# Patient Record
Sex: Female | Born: 1960 | Hispanic: Yes | Marital: Married | Smoking: Former smoker
Health system: Southern US, Community
[De-identification: ages and names within clinical notes are randomized; demographics above are authoritative.]

## PROBLEM LIST (undated history)

## (undated) DIAGNOSIS — I10 Essential (primary) hypertension: Secondary | ICD-10-CM

## (undated) DIAGNOSIS — M75100 Unspecified rotator cuff tear or rupture of unspecified shoulder, not specified as traumatic: Secondary | ICD-10-CM

## (undated) DIAGNOSIS — E785 Hyperlipidemia, unspecified: Secondary | ICD-10-CM

## (undated) DIAGNOSIS — H269 Unspecified cataract: Secondary | ICD-10-CM

## (undated) HISTORY — PX: BREAST EXCISIONAL BIOPSY: SUR124

## (undated) HISTORY — DX: Unspecified rotator cuff tear or rupture of unspecified shoulder, not specified as traumatic: M75.100

## (undated) HISTORY — DX: Hyperlipidemia, unspecified: E78.5

## (undated) HISTORY — PX: STOMACH SURGERY: SHX791

## (undated) HISTORY — DX: Unspecified cataract: H26.9

---

## 1993-11-28 HISTORY — PX: EYE SURGERY: SHX253

## 1996-11-28 HISTORY — PX: INNER EAR SURGERY: SHX679

## 1999-11-29 HISTORY — PX: BREAST EXCISIONAL BIOPSY: SUR124

## 2011-08-02 ENCOUNTER — Other Ambulatory Visit: Payer: Self-pay | Admitting: Sports Medicine

## 2011-08-02 DIAGNOSIS — M545 Low back pain, unspecified: Secondary | ICD-10-CM

## 2011-08-06 ENCOUNTER — Ambulatory Visit
Admission: RE | Admit: 2011-08-06 | Discharge: 2011-08-06 | Disposition: A | Discharge status: Home / Self Care | Payer: Federal, State, Local not specified - PPO | Source: Ambulatory Visit | Attending: Sports Medicine | Admitting: Sports Medicine

## 2011-08-06 DIAGNOSIS — M545 Low back pain, unspecified: Secondary | ICD-10-CM

## 2011-10-17 ENCOUNTER — Encounter: Payer: Self-pay | Admitting: Emergency Medicine

## 2011-10-17 ENCOUNTER — Emergency Department (HOSPITAL_COMMUNITY)
Admission: EM | Admit: 2011-10-17 | Discharge: 2011-10-17 | Disposition: A | Discharge status: Home / Self Care | Payer: Federal, State, Local not specified - PPO | Attending: Emergency Medicine | Admitting: Emergency Medicine

## 2011-10-17 DIAGNOSIS — R51 Headache: Secondary | ICD-10-CM | POA: Insufficient documentation

## 2011-10-17 DIAGNOSIS — B029 Zoster without complications: Secondary | ICD-10-CM | POA: Insufficient documentation

## 2011-10-17 DIAGNOSIS — I10 Essential (primary) hypertension: Secondary | ICD-10-CM | POA: Insufficient documentation

## 2011-10-17 DIAGNOSIS — R21 Rash and other nonspecific skin eruption: Secondary | ICD-10-CM | POA: Insufficient documentation

## 2011-10-17 DIAGNOSIS — X58XXXA Exposure to other specified factors, initial encounter: Secondary | ICD-10-CM | POA: Insufficient documentation

## 2011-10-17 DIAGNOSIS — E119 Type 2 diabetes mellitus without complications: Secondary | ICD-10-CM | POA: Insufficient documentation

## 2011-10-17 DIAGNOSIS — Z79899 Other long term (current) drug therapy: Secondary | ICD-10-CM | POA: Insufficient documentation

## 2011-10-17 DIAGNOSIS — Z7982 Long term (current) use of aspirin: Secondary | ICD-10-CM | POA: Insufficient documentation

## 2011-10-17 DIAGNOSIS — L089 Local infection of the skin and subcutaneous tissue, unspecified: Secondary | ICD-10-CM

## 2011-10-17 HISTORY — DX: Essential (primary) hypertension: I10

## 2011-10-17 MED ORDER — PROMETHAZINE HCL 25 MG PO TABS: 25.0000 mg | ORAL_TABLET | Freq: Four times a day (QID) | ORAL | Status: AC | PRN

## 2011-10-17 MED ORDER — ONDANSETRON 4 MG PO TBDP: 8.0000 mg | ORAL_TABLET | Freq: Once | ORAL | Status: DC

## 2011-10-17 MED ORDER — ACYCLOVIR 400 MG PO TABS: 800.0000 mg | ORAL_TABLET | Freq: Every day | ORAL | Status: AC

## 2011-10-17 MED ORDER — CEPHALEXIN 500 MG PO CAPS: 500.0000 mg | ORAL_CAPSULE | Freq: Four times a day (QID) | ORAL | Status: AC

## 2011-10-17 MED ORDER — ONDANSETRON 4 MG PO TBDP
ORAL_TABLET | ORAL | Status: AC
  Administered 2011-10-17: 8 mg via SUBLINGUAL
  Filled 2011-10-17: qty 2

## 2011-10-17 MED ORDER — OXYCODONE-ACETAMINOPHEN 5-325 MG PO TABS: 1.0000 | ORAL_TABLET | ORAL | Status: AC | PRN

## 2011-10-17 MED ORDER — FLUORESCEIN SODIUM 1 MG OP STRP
1.0000 | ORAL_STRIP | Freq: Once | OPHTHALMIC | Status: AC
  Administered 2011-10-17: 1 via OPHTHALMIC
  Filled 2011-10-17: qty 1

## 2011-10-17 MED ORDER — PROPARACAINE HCL 0.5 % OP SOLN
1.0000 [drp] | Freq: Once | OPHTHALMIC | Status: AC
  Administered 2011-10-17: 1 [drp] via OPHTHALMIC
  Filled 2011-10-17 (×2): qty 15

## 2011-10-17 MED ORDER — PREDNISONE 20 MG PO TABS: 20.0000 mg | ORAL_TABLET | Freq: Every day | ORAL | Status: AC

## 2011-10-17 MED ORDER — HYDROMORPHONE HCL PF 2 MG/ML IJ SOLN
2.0000 mg | Freq: Once | INTRAMUSCULAR | Status: AC
  Administered 2011-10-17: 2 mg via INTRAMUSCULAR
  Filled 2011-10-17: qty 1

## 2011-10-17 NOTE — ED Notes (Signed)
Patient signed discharge instructions but feels nauseated.  Nedra Hai, RN notified.

## 2011-10-17 NOTE — ED Notes (Signed)
Rash to right forehead and scalp, started as one spot above eye that disappeared and then a few more popped up and then continued to spread, painful and itchy.  No problems with eye itself.  Saw dr Thursday and was placed on prednisone with no relief.

## 2011-10-17 NOTE — ED Notes (Signed)
Pt states pain medicine helped pain but has made her nauseated, given zofran as ordered for same.

## 2011-10-17 NOTE — ED Provider Notes (Signed)
Medical screening examination/treatment/procedure(s) were performed by non-physician practitioner and as supervising physician I was immediately available for consultation/collaboration.  [name]. Rubin Payor, MD 10/17/11 [phone]

## 2011-10-17 NOTE — ED Provider Notes (Signed)
History     CSN: [id number] Arrival date & time: 10/17/2011  8:26 AM   First MD Initiated Contact with Patient 10/17/11 [phone]      Chief Complaint  Patient presents with  . Rash   HPI Patient seen and evaluated at [phone]. Reports that she developed a rash on her right forehead one week prior to her arrival to the emergency room. Patient states that the lesions have spread. States that the pain radiates into her head. States that it feels as though she is being "electricuted" on her scalp. Patient denies any visual deficits or eye pain. Denies any respiratory distress. Patient states that she was started on Prednisone by her primary care physician Dr. Barry Dienes. Patient denies any other complaints.   Past Medical History  Diagnosis Date  . Diabetes mellitus   . Hypertension     Past Surgical History  Procedure Date  . Eye surgery     No family history on file.  History  Substance Use Topics  . Smoking status: Never Smoker   . Smokeless tobacco: Not on file  . Alcohol Use: Yes     socially    OB History    Grav Para Term Preterm Abortions TAB SAB Ect Mult Living                  Review of Systems  Constitutional: Negative for fever, chills, diaphoresis and appetite change.  HENT: Negative for ear pain, sore throat, trouble swallowing, neck pain, neck stiffness and voice change.   Eyes: Negative for photophobia, pain, discharge, redness, itching and visual disturbance.  Respiratory: Negative for cough, chest tightness and shortness of breath.   Cardiovascular: Negative for chest pain.  Gastrointestinal: Negative for nausea, vomiting and abdominal pain.  Genitourinary: Negative for flank pain.  Musculoskeletal: Negative for back pain.  Skin: Positive for rash. Negative for color change and wound.  Neurological: Negative for weakness and numbness.  All other systems reviewed and are negative.    Allergies  Review of patient's allergies indicates no known  allergies.  Home Medications   Current Outpatient Rx  Name Route Sig Dispense Refill  . ASPIRIN EC 81 MG PO TBEC Oral Take 81 mg by mouth daily.      [facility] Ophthalmic Apply 1-2 drops to eye daily as needed. In right eye. For itchy eyes.     Marland Kitchen FEXOFENADINE HCL 180 MG PO TABS Oral Take 180 mg by mouth daily.      Marland Kitchen GLIMEPIRIDE 1 MG PO TABS Oral Take 0.5 mg by mouth 2 (two) times daily.      . IBUPROFEN 200 MG PO TABS Oral Take 400 mg by mouth every 6 (six) hours as needed. For pain.     Marland Kitchen LOSARTAN POTASSIUM-HCTZ 100-25 MG PO TABS Oral Take 1 tablet by mouth daily.      Marland Kitchen METFORMIN HCL 1000 MG PO TABS Oral Take 1,000 mg by mouth 2 (two) times daily with a meal.      . METOPROLOL SUCCINATE 50 MG PO TB24 Oral Take 50 mg by mouth daily.      . MOMETASONE FUROATE 50 MCG/ACT NA SUSP Nasal Place 2 sprays into the nose daily.      Marland Kitchen PREDNISONE 10 MG PO TABS Oral Take 10-60 mg by mouth daily. Tapered dose.started taking them on Thursday 11/15. Took 6 on 11/15, 5 on the 2nd day on 11/16, 4 on 11/17, 3 on 11/18, take 2 on 11/19 and 1  tab on 11/20.     Marland Kitchen SIMVASTATIN 40 MG PO TABS Oral Take 40 mg by mouth at bedtime.        BP 141/88  Pulse 78  Temp(Src) 97.8 F (36.6 C) (Oral)  Resp 16  SpO2 98%  Physical Exam  Nursing note and vitals reviewed. Constitutional: She is oriented to person, place, and time. She appears well-developed and well-nourished. No distress.  HENT:  Head: Normocephalic and atraumatic.    Eyes: Conjunctivae and EOM are normal. Pupils are equal, round, and reactive to light. Right eye exhibits no discharge. Left eye exhibits no discharge. No scleral icterus.         No dendritic lesions noted on examination with fluorescein and propocaine. No abnormalities.   Neck: Normal range of motion. Neck supple. No tracheal deviation present.  Cardiovascular: Normal rate, regular rhythm and normal heart sounds.  Exam reveals no gallop and no friction rub.   No murmur  heard. Pulmonary/Chest: Effort normal and breath sounds normal. No stridor. No respiratory distress. She has no wheezes. She has no rales.  Abdominal: Soft. Bowel sounds are normal. She exhibits no distension. There is no tenderness.  Musculoskeletal: Normal range of motion.  Lymphadenopathy:    She has no cervical adenopathy.  Neurological: She is alert and oriented to person, place, and time. No cranial nerve deficit. Coordination normal.  Skin: Skin is warm and dry. Rash noted. She is not diaphoretic.  Psychiatric: She has a normal mood and affect. Her behavior is normal. Judgment and thought content normal.    ED Course  Procedures (including critical care time)  Patient seen and evaluated.  VSS reviewed. . Nursing notes reviewed. Discussed with and seen with Dr. Rubin Payor, attending physician. Initial testing ordered. Will monitor the patient closely. They agree with the treatment plan and diagnosis.  Patient has a superimposed infection, will treat with keflex. Recommendation per dr. Rubin Payor.   MDM  Rash        Demetrius Charity, PA 10/17/11 1023

## 2011-11-29 HISTORY — PX: COLONOSCOPY: SHX174

## 2011-12-26 ENCOUNTER — Ambulatory Visit: Payer: Federal, State, Local not specified - PPO | Admitting: Family Medicine

## 2012-01-27 ENCOUNTER — Ambulatory Visit (INDEPENDENT_AMBULATORY_CARE_PROVIDER_SITE_OTHER): Payer: Federal, State, Local not specified - PPO | Admitting: Surgery

## 2012-02-14 ENCOUNTER — Ambulatory Visit: Payer: Federal, State, Local not specified - PPO | Admitting: Family Medicine

## 2012-02-15 ENCOUNTER — Other Ambulatory Visit (INDEPENDENT_AMBULATORY_CARE_PROVIDER_SITE_OTHER): Payer: Self-pay | Admitting: Surgery

## 2012-02-15 ENCOUNTER — Encounter (INDEPENDENT_AMBULATORY_CARE_PROVIDER_SITE_OTHER): Payer: Self-pay | Admitting: Surgery

## 2012-02-15 ENCOUNTER — Ambulatory Visit (INDEPENDENT_AMBULATORY_CARE_PROVIDER_SITE_OTHER): Payer: Federal, State, Local not specified - PPO | Admitting: Surgery

## 2012-02-15 HISTORY — DX: Morbid (severe) obesity due to excess calories: E66.01

## 2012-02-15 NOTE — Progress Notes (Signed)
Re:   Megan Gomez DOB:   Apr 17, 1961 MRN:   [id number]  ASSESSMENT AND PLAN: 1.  Morbid obesity, weight - 219, BMI -38.8.  Per the 1991 NIH Consensus Statement, the patient is a candidate for bariatric surgery.  The patient attended our information session and reviewed the different types of bariatric surgery.    The patient is interested in the laparoscopic adjustable gastric band.  I discussed with the patient the indications and risks of lap band surgery.  The potential risks of surgery include, but are not limited to, bleeding, infection, DVT and PE, slippage and erosion of the band, open surgery, and death.  The patient understands the importance of compliance and long term follow-up with our group after surgery.  She was given literature regarding lap band surgery and encouraged to visit their web site, [email or web address], and register.  From here we'll obtain labs, x-rays, nutrition consult, and psych consult.  2.  Hypertension.  Since 2008. 3.  Diabetes mellitus.  Since 2001.  Sees Dr. Ester Rink. 4.  Back disease.  History of 3 ruptured disks.  Chief Complaint  Patient presents with  . Other    new bariatric- lap band   REFERRING PHYSICIAN:  Dr. Ester Rink.  HISTORY OF PRESENT ILLNESS: Megan Gomez is a 51 y.o. (DOB: January 21, 1961)  white female whose primary care physician is Nelwyn Salisbury, MD, MD (She was seeing Dr. Jillyn Hidden, but is becoming a new patient of Dr. Claris Che.) and comes to me today for bariatric surgery.  She has a sister who had a RYGB in [place].  She has done well.  The patient has been doing information session that I spoke at. She has actually been twice to the information sessions.  She does not know anyone who has a LAP-BAND, but is interested in lap band surgery. At the last information session, he got to feel the lap band port.  I talked about attending the support group.  She has tried Weight Watchers, Atkins diet, starvation diets, and some injections while  she lived [place], [place]. She had the most success with Atkins diet, but was concerned about the long-term effects of being on the diet.  She has no history of stomach disease.  No history of liver disease.  No history of gall bladder disease.  No history of pancreas disease.  No history of colon disease.   Past Medical History  Diagnosis Date  . Diabetes mellitus   . Hypertension   . Hyperlipidemia       Past Surgical History  Procedure Date  . Eye surgery   . Cesarean section 2001, 2005  . Breast biopsy 2004    left  . Inner ear surgery 1998     Current Outpatient Prescriptions  Medication Sig Dispense Refill  . aspirin EC 81 MG tablet Take 81 mg by mouth daily.        Marland Kitchen glimepiride (AMARYL) 1 MG tablet Take 0.5 mg by mouth 2 (two) times daily.        Marland Kitchen loratadine (CLARITIN) 10 MG tablet Take 10 mg by mouth daily.      Marland Kitchen losartan-hydrochlorothiazide (HYZAAR) 100-25 MG per tablet Take 1 tablet by mouth daily.        . metFORMIN (GLUCOPHAGE) 1000 MG tablet Take 1,000 mg by mouth 2 (two) times daily with a meal.        . metoprolol (TOPROL-XL) 50 MG 24 hr tablet Take 50 mg by mouth daily.        Marland Kitchen  simvastatin (ZOCOR) 40 MG tablet Take 40 mg by mouth at bedtime.          Allergies  Allergen Reactions  . Demerol Nausea And Vomiting  . Stadol (Butorphanol Tartrate) Nausea And Vomiting    REVIEW OF SYSTEMS: Skin:  No history of rash.  No history of abnormal moles. Infection:  No history of hepatitis or HIV.  No history of MRSA. Neurologic:  No history of stroke.  No history of seizure.  No history of headaches. Cardiac:  Hypertension x 5 years. No history of heart disease.  No history of prior cardiac catheterization.  No history of seeing a cardiologist. Pulmonary:  Does not smoke cigarettes.  No asthma or bronchitis.  No OSA/CPAP.  Endocrine:  Diabetes x 10 years.  Sees Dr. Ester Rink. No thyroid disease. Gastrointestinal:  See HPI. Urologic:  No history of kidney  stones.  No history of bladder infections. GYN:  Irregular periods.  She has two children, 8 and 12, both by C section. Musculoskeletal:  No history of joint or back disease. Hematologic:  Legs cramp.  She has 3 disks ruptured in her back. She dislocated her hip in 2011. Psycho-social:  The patient is oriented.   The patient has no obvious psychologic or social impairment to understanding our conversation and plan.  SOCIAL and FAMILY HISTORY: Married.  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 2 children: 8 and 12 years.  PHYSICAL EXAM: BP 162/98  Pulse 88  Temp(Src) 98.2 F (36.8 C) (Temporal)  Resp 12  Ht 5\' 3"  (1.6 m)  Wt 219 lb 3.2 oz (99.428 kg)  BMI 38.83 kg/m2  General: WN obese WF who is alert and generally healthy appearing.  HEENT: Normal. Pupils equal. Good dentition. Neck: Supple. No mass.  No thyroid mass.  Carotid pulse okay with no bruit. Lymph Nodes:  No supraclavicular or cervical nodes. Lungs: Clear to auscultation and symmetric breath sounds.  Heart:  RRR. No murmur or rub. Abdomen: Soft. No mass. No tenderness. No hernia. Normal bowel sounds.  C section scar. Rectal: Guaiac neg stool. Extremities:  Good strength and ROM  in upper and lower extremities. Neurologic:  Grossly intact to motor and sensory function. Psychiatric: Has normal mood and affect. Behavior is normal.   DATA REVIEWED: Chart information.  Ovidio Kin, MD,  [facility] [address] [place]  [address]   [place], [place]    [address] Phone:  [phone] FAX:  [phone]

## 2012-03-13 ENCOUNTER — Ambulatory Visit: Payer: Federal, State, Local not specified - PPO | Admitting: Family Medicine

## 2012-03-14 ENCOUNTER — Ambulatory Visit (HOSPITAL_COMMUNITY): Payer: Federal, State, Local not specified - PPO

## 2012-03-28 ENCOUNTER — Encounter: Payer: Self-pay | Admitting: *Deleted

## 2012-03-28 ENCOUNTER — Encounter: Payer: Federal, State, Local not specified - PPO | Attending: Surgery | Admitting: *Deleted

## 2012-03-28 DIAGNOSIS — Z01818 Encounter for other preprocedural examination: Secondary | ICD-10-CM | POA: Insufficient documentation

## 2012-03-28 DIAGNOSIS — Z713 Dietary counseling and surveillance: Secondary | ICD-10-CM | POA: Insufficient documentation

## 2012-03-28 NOTE — Patient Instructions (Signed)
   Follow Pre-Op Nutrition Goals to prepare for Lap Band Surgery.   Call the [facility] at [phone] once you have been given your surgery date to enrolled in the Pre-Op Nutrition Class. You will need to attend this nutrition class 3-4 weeks prior to your surgery.  

## 2012-03-28 NOTE — Progress Notes (Signed)
  Pre-Op Assessment Visit:  Pre-Operative LAGB Surgery  Medical Nutrition Therapy:  Appt start time: 1500 end time:  1600.  Patient was seen on 03/28/2012 for Pre-Operative LAGB Nutrition Assessment. Assessment and letter of approval faxed to [facility] Bariatric Surgery Program coordinator on 03/29/12.  Approval letter and Candace Gallus results sent to [facility] center and will be available in the chart under the media tab.  TANITA  BODY COMP RESULTS  03/28/12    %Fat 45.5%    FM (lbs) 101.0    FFM (lbs) 121.0    TBW (lbs) 88.5   Handouts given during visit include:  Pre-Op Goals   Bariatric Protein Shakes  B.E.L.T. Program Flyer  Patient to call for Pre-Op and Post-Op Nutrition Education at the [facility] when surgery is scheduled.

## 2012-04-04 ENCOUNTER — Ambulatory Visit (HOSPITAL_COMMUNITY)
Admission: RE | Admit: 2012-04-04 | Discharge: 2012-04-04 | Disposition: A | Discharge status: Home Health | Payer: Federal, State, Local not specified - PPO | Source: Ambulatory Visit | Attending: Surgery | Admitting: Surgery

## 2012-04-04 ENCOUNTER — Encounter (HOSPITAL_COMMUNITY)
Admission: RE | Disposition: A | Discharge status: Home Health | Payer: Self-pay | Source: Ambulatory Visit | Attending: Surgery

## 2012-04-04 DIAGNOSIS — E669 Obesity, unspecified: Secondary | ICD-10-CM | POA: Insufficient documentation

## 2012-04-04 DIAGNOSIS — Z01818 Encounter for other preprocedural examination: Secondary | ICD-10-CM | POA: Insufficient documentation

## 2012-04-04 HISTORY — PX: BREATH TEK H PYLORI: SHX5422

## 2012-04-04 SURGERY — BREATH TEST, FOR HELICOBACTER PYLORI
Anesthesia: Choice

## 2012-04-05 ENCOUNTER — Encounter (HOSPITAL_COMMUNITY): Payer: Self-pay

## 2012-04-05 ENCOUNTER — Encounter (HOSPITAL_COMMUNITY): Payer: Self-pay | Admitting: Surgery

## 2012-04-13 ENCOUNTER — Ambulatory Visit (HOSPITAL_COMMUNITY)
Admission: RE | Admit: 2012-04-13 | Discharge: 2012-04-13 | Disposition: A | Discharge status: Home / Self Care | Payer: Federal, State, Local not specified - PPO | Source: Ambulatory Visit | Attending: Surgery | Admitting: Surgery

## 2012-04-13 ENCOUNTER — Encounter (HOSPITAL_COMMUNITY): Payer: Self-pay

## 2012-04-13 ENCOUNTER — Other Ambulatory Visit: Payer: Self-pay

## 2012-04-13 DIAGNOSIS — I10 Essential (primary) hypertension: Secondary | ICD-10-CM | POA: Insufficient documentation

## 2012-04-13 DIAGNOSIS — R12 Heartburn: Secondary | ICD-10-CM | POA: Insufficient documentation

## 2012-04-13 DIAGNOSIS — Z6838 Body mass index (BMI) 38.0-38.9, adult: Secondary | ICD-10-CM | POA: Insufficient documentation

## 2012-04-13 DIAGNOSIS — K7689 Other specified diseases of liver: Secondary | ICD-10-CM | POA: Insufficient documentation

## 2012-04-13 DIAGNOSIS — K219 Gastro-esophageal reflux disease without esophagitis: Secondary | ICD-10-CM | POA: Insufficient documentation

## 2012-04-13 DIAGNOSIS — E119 Type 2 diabetes mellitus without complications: Secondary | ICD-10-CM | POA: Insufficient documentation

## 2012-04-13 DIAGNOSIS — R160 Hepatomegaly, not elsewhere classified: Secondary | ICD-10-CM | POA: Insufficient documentation

## 2012-04-13 DIAGNOSIS — N200 Calculus of kidney: Secondary | ICD-10-CM | POA: Insufficient documentation

## 2012-04-13 DIAGNOSIS — E785 Hyperlipidemia, unspecified: Secondary | ICD-10-CM | POA: Insufficient documentation

## 2012-04-20 ENCOUNTER — Other Ambulatory Visit (INDEPENDENT_AMBULATORY_CARE_PROVIDER_SITE_OTHER): Payer: Self-pay | Admitting: Surgery

## 2012-04-20 ENCOUNTER — Telehealth (INDEPENDENT_AMBULATORY_CARE_PROVIDER_SITE_OTHER): Payer: Self-pay | Admitting: General Surgery

## 2012-04-20 NOTE — Telephone Encounter (Signed)
Kesha from [facility] called to state that the patient advised she had the A1C performed last month by her doctor at [facility] and her insurance will not pay for the one ordered by Dr. Ezzard Standing. I advised the other tests ordered (CBC, TSH, CMET) need to proceed as planned and for the patient to please contact her doctor who originally ordered the A1C to fax those results to Dr. Allene Pyo attention. And that if those results (based on timing) were not sufficient, then he or his nurse will advise.

## 2012-04-21 LAB — CBC WITH DIFFERENTIAL/PLATELET
Hemoglobin: 13 g/dL (ref 12.0–15.0)
Lymphocytes Relative: 23 % (ref 12–46)
Lymphs Abs: 1.8 10*3/uL (ref 0.7–4.0)
MCH: 27.8 pg (ref 26.0–34.0)
Monocytes Relative: 5 % (ref 3–12)
Neutrophils Relative %: 70 % (ref 43–77)
Platelets: 336 10*3/uL (ref 150–400)
RBC: 4.68 MIL/uL (ref 3.87–5.11)
WBC: 8 10*3/uL (ref 4.0–10.5)

## 2012-04-21 LAB — COMPREHENSIVE METABOLIC PANEL
ALT: 26 U/L (ref 0–35)
Albumin: 4.2 g/dL (ref 3.5–5.2)
CO2: 28 mEq/L (ref 19–32)
Calcium: 9.2 mg/dL (ref 8.4–10.5)
Chloride: 102 mEq/L (ref 96–112)
Glucose, Bld: 147 mg/dL — ABNORMAL HIGH (ref 70–99)
Potassium: 4.1 mEq/L (ref 3.5–5.3)
Sodium: 138 mEq/L (ref 135–145)
Total Bilirubin: 0.3 mg/dL (ref 0.3–1.2)
Total Protein: 7 g/dL (ref 6.0–8.3)

## 2012-04-21 LAB — TSH: TSH: 1.575 u[IU]/mL (ref 0.350–4.500)

## 2012-04-24 ENCOUNTER — Telehealth (INDEPENDENT_AMBULATORY_CARE_PROVIDER_SITE_OTHER): Payer: Self-pay

## 2012-04-24 NOTE — Telephone Encounter (Signed)
I left a voicemail requesting a copy of the latest Hgb A1c result faxed to [place]

## 2012-06-12 ENCOUNTER — Encounter (INDEPENDENT_AMBULATORY_CARE_PROVIDER_SITE_OTHER): Payer: Self-pay

## 2012-07-13 ENCOUNTER — Other Ambulatory Visit (INDEPENDENT_AMBULATORY_CARE_PROVIDER_SITE_OTHER): Payer: Self-pay | Admitting: Surgery

## 2012-08-02 ENCOUNTER — Encounter: Payer: Federal, State, Local not specified - PPO | Attending: Surgery | Admitting: *Deleted

## 2012-08-02 DIAGNOSIS — Z01818 Encounter for other preprocedural examination: Secondary | ICD-10-CM | POA: Insufficient documentation

## 2012-08-02 DIAGNOSIS — Z713 Dietary counseling and surveillance: Secondary | ICD-10-CM | POA: Insufficient documentation

## 2012-08-02 NOTE — Progress Notes (Addendum)
Bariatric Class:  Appt start time: 1530 end time:  1630.  Pre-Operative Nutrition Class  Patient was seen on 08/02/12 for Pre-Operative Bariatric Surgery Education at [facility].   Surgery date: 08/28/12 Surgery type: LAGB Start weight at [facility]: 222 lbs  Weight today: 220.2 lbs  Samples given per MNT protocol: Bariatric Advantage Multivitamin  Lot # [id number]  Exp: 12/13   Opurity Calcium Citrate  Lot # [id number]  Exp: 11/14   Opurity Band-Optimized Multivitamin  Lot # [id number]  Exp: 02/14   Celebrate Vitamins Multivitamin (2)  Lot # [id number]; [id number]  Exp: 09/14; 01/15   Celebrate Vitamins Multivitamin-Complete  Lot # [id number]  Exp: 06/14   Celebrate Vitamins Iron + C (18mg )  Lot # [id number]  Exp: 03/15   Celebrate Vitamins Sublingual B12  Lot # [id number]  Exp: 05/15   Corliss Marcus Protein Powder  Lot # [id number]  Exp: 09/14  The following the learning objective met by the patient during this course:   Identifies Pre-Op Dietary Goals and will begin 2 weeks pre-operatively  Identifies appropriate sources of fluids and proteins   States protein recommendations and appropriate sources pre and post-operatively  Identifies Post-Operative Dietary Goals and will follow for 2 weeks post-operatively  Identifies appropriate multivitamin and calcium sources  Describes the need for physical activity post-operatively and will follow MD recommendations  States when to call healthcare provider regarding medication questions or post-operative complications  Handouts given during class include:  Pre-Op Bariatric Surgery Diet Handout  Protein Shake Handout  Post-Op Bariatric Surgery Nutrition Handout  BELT Program Information Flyer  Support Group Information Flyer  Follow-Up Plan: Patient will follow-up at [facility] 2 weeks post operatively for diet advancement per MD.

## 2012-08-03 NOTE — Patient Instructions (Signed)
Follow:   Pre-Op Diet per MD 2 weeks prior to surgery  Phase 2- Liquids (clear/full) 2 weeks after surgery  Vitamin/Mineral/Calcium guidelines for purchasing bariatric supplements  Exercise guidelines pre and post-op per MD  Follow-up at [facility] in 2 weeks post-op for diet advancement. Contact Zephaniah Lubrano as needed with questions/concerns. 

## 2012-08-04 ENCOUNTER — Encounter: Payer: Self-pay | Admitting: *Deleted

## 2012-08-16 ENCOUNTER — Encounter (HOSPITAL_COMMUNITY): Payer: Self-pay | Admitting: Pharmacy Technician

## 2012-08-17 ENCOUNTER — Encounter (INDEPENDENT_AMBULATORY_CARE_PROVIDER_SITE_OTHER): Payer: Self-pay | Admitting: Surgery

## 2012-08-17 ENCOUNTER — Ambulatory Visit (INDEPENDENT_AMBULATORY_CARE_PROVIDER_SITE_OTHER): Payer: Federal, State, Local not specified - PPO | Admitting: Surgery

## 2012-08-17 NOTE — Progress Notes (Signed)
Re:   Megan Gomez DOB:   02-22-1961 MRN:   [id number]  ASSESSMENT AND PLAN: 1.  Morbid obesity, weight - 219, BMI -38.8.  Per the 1991 NIH Consensus Statement, the patient is a candidate for bariatric surgery.  The patient attended our information session and reviewed the different types of bariatric surgery.    The patient is interested in the laparoscopic adjustable gastric band.  I discussed with the patient the indications and risks of lap band surgery.  The potential risks of surgery include, but are not limited to, bleeding, infection, DVT and PE, slippage and erosion of the band, open surgery, and death.  The patient understands the importance of compliance and long term follow-up with our group after surgery.  She has completed her pre op eval and is scheduled for surgery on 08/28/2012.  She has started her pre op diet and has no significant questions.  She wants to be done as an outpatient and we will decide the day of surgery, but she should be able to go home.  2.  Hypertension.  Since 2008. 3.  Diabetes mellitus.  Oral hypoglycemics only.  Since 2001.  Sees Dr. Ester Rink.  He has outlined a post op plan for her with her diabetes. 4.  Back disease.  History of 3 ruptured disks. 5.  Left kidney stone by [place]. 6.  Hepatic steatosis by [place]. 7.  Tertiary esophageal spasms and GERD - essentially asymptomatic on UGI.  No chief complaint on file.  REFERRING PHYSICIAN:  Dr. Ester Rink.  HISTORY OF PRESENT ILLNESS: Megan Gomez is a 51 y.o. (DOB: 27-Oct-1961)  white female whose primary care physician is FULP, CAMMIE, MD (She was seeing Dr. Jillyn Hidden, but is becoming a new patient of Dr. Claris Che.) and comes to me today for pre op visit for a lap band scheduled 08/28/2012.  She has a sister who had a RYGB in [place].  She has done well.  I went over with her the surgery.  We talked about diabetes and the lap band.  I said that we do encourage patients with severe insulin dependent diabetes  towards the RYGB, but with weight loss, the lap band does help diabetes.  I reviewed with her the model of the lap band and the post op course.  UGI - 02/15/2012 - Marked reflux with tertiary contractions, no HH. [place] - 02/15/2012 - Hepatic steatosis. Psychiatry - 06/09/2012 - Dr. Cyndia Skeeters She brought paper work with her - Bone density - okay, she is set up for a colonoscopy by Dr. Loreta Ave in November 2013, 08/09/2012 - negative mammogram, 9/ 13/2013 - normal pap smear.  She has no history of stomach disease.  No history of liver disease.  No history of gall bladder disease.  No history of pancreas disease.  No history of colon disease.   Past Medical History  Diagnosis Date  . Diabetes mellitus   . Hypertension   . Hyperlipidemia     Current Outpatient Prescriptions  Medication Sig Dispense Refill  . aspirin EC 81 MG tablet Take 81 mg by mouth daily.        . B Complex-C (B-COMPLEX WITH VITAMIN C) tablet Take 1 tablet by mouth daily.      . Biotin (RA BIOTIN) 1000 MCG tablet Take 2,000 mcg by mouth daily.      . Calcium Carbonate-Vitamin D (CALCIUM 600 + D PO) Take 2 tablets by mouth daily.      Marland Kitchen glimepiride (AMARYL) 1 MG  tablet Take 0.5 mg by mouth 2 (two) times daily.        Marland Kitchen losartan-hydrochlorothiazide (HYZAAR) 100-25 MG per tablet Take 1 tablet by mouth daily before breakfast.       . metFORMIN (GLUCOPHAGE) 1000 MG tablet Take 1,000 mg by mouth 2 (two) times daily with a meal.        . metoprolol tartrate (LOPRESSOR) 25 MG tablet Take 25 mg by mouth 2 (two) times daily.      . Multiple Vitamin (MULTIVITAMIN) capsule Take 1 capsule by mouth daily.      . simvastatin (ZOCOR) 40 MG tablet Take 40 mg by mouth at bedtime.        Marland Kitchen DISCONTD: fexofenadine (ALLEGRA) 180 MG tablet Take 180 mg by mouth as needed.       Marland Kitchen DISCONTD: mometasone (NASONEX) 50 MCG/ACT nasal spray Place 2 sprays into the nose daily.          Allergies  Allergen Reactions  . Cephalexin Swelling  . Demerol Nausea And  Vomiting  . Stadol (Butorphanol Tartrate) Nausea And Vomiting    REVIEW OF SYSTEMS:  Cardiac:  Hypertension x 5 years. No history of heart disease.  No history of prior cardiac catheterization.    Endocrine:  Diabetes x 10 years.  Sees Dr. Ester Rink. No thyroid disease. Gastrointestinal:  See HPI. GYN:  Irregular periods.  She has two children, 8 and 12, both by C section.  Hematologic:  Legs cramp.  She has 3 disks ruptured in her back. She dislocated her hip in 2011. Psycho-social:  The patient is oriented.   The patient has no obvious psychologic or social impairment to understanding our conversation and plan.  She saw Dr. Cyndia Skeeters for psychiatry.  SOCIAL and FAMILY HISTORY: Married.  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 2 children: 8 and 12 years.  PHYSICAL EXAM: There were no vitals taken for this visit.  General: WN obese WF who is alert and generally healthy appearing.  HEENT: Normal. Pupils equal. Good dentition. Neck: Supple. No mass.  No thyroid mass.   Lymph Nodes:  No supraclavicular or cervical nodes. Lungs: Clear to auscultation and symmetric breath sounds. Heart:  RRR. No murmur or rub.  Abdomen: Soft. No mass. No tenderness. No hernia. Normal bowel sounds.  C section scar.  She is more apple than pear. Extremities:  Good strength and ROM  in upper and lower extremities. Neurologic:  Grossly intact to motor and sensory function. Psychiatric: Has normal mood and affect. Behavior is normal.   DATA REVIEWED: Reports in the HPI> TSH - 1.575 - 04/20/2012.  Ovidio Kin, MD,  [facility] [address] [place]  [address]   [place], [place]    [address] Phone:  [phone] FAX:  [phone]

## 2012-08-22 ENCOUNTER — Ambulatory Visit (HOSPITAL_COMMUNITY)
Admission: RE | Admit: 2012-08-22 | Discharge: 2012-08-22 | Disposition: A | Discharge status: Home / Self Care | Payer: Federal, State, Local not specified - PPO | Source: Ambulatory Visit | Attending: Surgery | Admitting: Surgery

## 2012-08-22 ENCOUNTER — Encounter (HOSPITAL_COMMUNITY)
Admission: RE | Admit: 2012-08-22 | Discharge: 2012-08-22 | Disposition: A | Discharge status: Home / Self Care | Payer: Federal, State, Local not specified - PPO | Source: Ambulatory Visit | Attending: Surgery | Admitting: Surgery

## 2012-08-22 ENCOUNTER — Encounter (HOSPITAL_COMMUNITY): Payer: Self-pay

## 2012-08-22 DIAGNOSIS — Z01812 Encounter for preprocedural laboratory examination: Secondary | ICD-10-CM | POA: Insufficient documentation

## 2012-08-22 DIAGNOSIS — Z01818 Encounter for other preprocedural examination: Secondary | ICD-10-CM | POA: Insufficient documentation

## 2012-08-22 LAB — HCG, SERUM, QUALITATIVE: Preg, Serum: NEGATIVE

## 2012-08-22 LAB — CBC
HCT: 37.8 % (ref 36.0–46.0)
Hemoglobin: 12.7 g/dL (ref 12.0–15.0)
MCHC: 33.6 g/dL (ref 30.0–36.0)
MCV: 82.7 fL (ref 78.0–100.0)

## 2012-08-22 LAB — BASIC METABOLIC PANEL
BUN: 14 mg/dL (ref 6–23)
Chloride: 101 mEq/L (ref 96–112)
Creatinine, Ser: 0.52 mg/dL (ref 0.50–1.10)
GFR calc non Af Amer: >90 mL/min (ref >90)
Glucose, Bld: 92 mg/dL (ref 70–99)
Potassium: 3.8 mEq/L (ref 3.5–5.1)

## 2012-08-22 NOTE — Progress Notes (Signed)
04-13-2012 ekg epic

## 2012-08-22 NOTE — Patient Instructions (Addendum)
20 Megan Gomez  08/22/2012   Your procedure is scheduled on:  08-28-2012  Report to [facility] at 0515  AM.  Call this number if you have problems the morning of surgery: [phone]   Remember:if you are discharged   Do not eat food or drink liquids:After Midnight.  .  Take these medicines the morning of surgery with A SIP OF WATER: metoprolol tartrate  Do not wear jewelry or make up.  Do not wear lotions, powders, or perfumes. You may wear deodorant.    Do not bring valuables to the hospital.  Contacts, dentures or bridgework may not be worn into surgery.  Leave suitcase in the car. After surgery it may be brought to your room.  For patients admitted to the hospital, checkout time is 11:00 AM the day of discharge                             Patients discharged the day of surgery will not be allowed to drive home. If going home same day of surgery, you must have someone stay with you the first 24 hours at home and arrange for some one to drive you home from hospital.    Special Instructions: See [facility] Preparing for Surgery instruction sheet. Women do not shave legs or underarms for 12 hours before showers. Men may shave face morning of surgery.    Please read over the following fact sheets that you were given: MRSA Information  Cain [facility] pre op nurse phone number [phone], call if needed

## 2012-08-22 NOTE — Progress Notes (Signed)
08/22/12 0937  OBSTRUCTIVE SLEEP APNEA  Have you ever been diagnosed with sleep apnea through a sleep study? No  Do you snore loudly (loud enough to be heard through closed doors)?  1  Do you often feel tired, fatigued, or sleepy during the daytime? 1  Has anyone observed you stop breathing during your sleep? 0  Do you have, or are you being treated for high blood pressure? 1  BMI more than 35 kg/m2? 1  Neck circumference greater than 40 cm/18 inches? 0  Gender: 0  Obstructive Sleep Apnea Score 4   Score 4 or greater  Results sent to PCP

## 2012-08-27 NOTE — Anesthesia Preprocedure Evaluation (Addendum)
Anesthesia Evaluation  Patient identified by MRN, date of birth, ID band Patient awake    Reviewed: Allergy & Precautions, H&P , NPO status , Patient's Chart, lab work & pertinent test results, reviewed documented beta blocker date and time   History of Anesthesia Complications (+) PONV  Airway Mallampati: II TM Distance: >3 FB Neck ROM: full    Dental  (+) Caps and Dental Advisory Given,    Pulmonary neg pulmonary ROS,  breath sounds clear to auscultation  Pulmonary exam normal       Cardiovascular hypertension, Pt. on home beta blockers Rhythm:regular Rate:Normal     Neuro/Psych negative neurological ROS  negative psych ROS   GI/Hepatic negative GI ROS, Neg liver ROS,   Endo/Other  diabetes, Well Controlled, Type 2, Oral Hypoglycemic AgentsMorbid obesity  Renal/GU negative Renal ROS  negative genitourinary   Musculoskeletal   Abdominal   Peds  Hematology negative hematology ROS (+)   Anesthesia Other Findings   Reproductive/Obstetrics negative OB ROS                        Anesthesia Physical Anesthesia Plan  ASA: III  Anesthesia Plan: General   Post-op Pain Management:    Induction: Intravenous  Airway Management Planned: Oral ETT  Additional Equipment:   Intra-op Plan:   Post-operative Plan: Extubation in OR  Informed Consent: I have reviewed the patients History and Physical, chart, labs and discussed the procedure including the risks, benefits and alternatives for the proposed anesthesia with the patient or authorized representative who has indicated his/her understanding and acceptance.   Dental Advisory Given  Plan Discussed with: CRNA and Surgeon  Anesthesia Plan Comments:        Anesthesia Quick Evaluation

## 2012-08-28 ENCOUNTER — Encounter (HOSPITAL_COMMUNITY)
Admission: RE | Disposition: A | Discharge status: Home / Self Care | Payer: Self-pay | Source: Ambulatory Visit | Attending: Surgery

## 2012-08-28 ENCOUNTER — Observation Stay (HOSPITAL_COMMUNITY)
Admission: RE | Admit: 2012-08-28 | Discharge: 2012-08-29 | Disposition: A | Discharge status: Home / Self Care | Payer: Federal, State, Local not specified - PPO | Source: Ambulatory Visit | Attending: Surgery | Admitting: Surgery

## 2012-08-28 ENCOUNTER — Ambulatory Visit (HOSPITAL_COMMUNITY): Payer: Federal, State, Local not specified - PPO | Admitting: Anesthesiology

## 2012-08-28 ENCOUNTER — Encounter (HOSPITAL_COMMUNITY): Payer: Self-pay | Admitting: Anesthesiology

## 2012-08-28 ENCOUNTER — Ambulatory Visit (HOSPITAL_COMMUNITY): Payer: Federal, State, Local not specified - PPO

## 2012-08-28 ENCOUNTER — Encounter (HOSPITAL_COMMUNITY): Payer: Self-pay

## 2012-08-28 DIAGNOSIS — Z7982 Long term (current) use of aspirin: Secondary | ICD-10-CM | POA: Insufficient documentation

## 2012-08-28 DIAGNOSIS — Z6838 Body mass index (BMI) 38.0-38.9, adult: Secondary | ICD-10-CM | POA: Insufficient documentation

## 2012-08-28 DIAGNOSIS — I10 Essential (primary) hypertension: Secondary | ICD-10-CM | POA: Insufficient documentation

## 2012-08-28 DIAGNOSIS — K224 Dyskinesia of esophagus: Secondary | ICD-10-CM | POA: Insufficient documentation

## 2012-08-28 DIAGNOSIS — K219 Gastro-esophageal reflux disease without esophagitis: Secondary | ICD-10-CM | POA: Insufficient documentation

## 2012-08-28 DIAGNOSIS — R110 Nausea: Secondary | ICD-10-CM | POA: Insufficient documentation

## 2012-08-28 DIAGNOSIS — Z79899 Other long term (current) drug therapy: Secondary | ICD-10-CM | POA: Insufficient documentation

## 2012-08-28 DIAGNOSIS — K7689 Other specified diseases of liver: Secondary | ICD-10-CM | POA: Insufficient documentation

## 2012-08-28 DIAGNOSIS — E119 Type 2 diabetes mellitus without complications: Secondary | ICD-10-CM

## 2012-08-28 DIAGNOSIS — N200 Calculus of kidney: Secondary | ICD-10-CM | POA: Insufficient documentation

## 2012-08-28 HISTORY — PX: LAPAROSCOPIC GASTRIC BANDING: SHX1100

## 2012-08-28 LAB — GLUCOSE, CAPILLARY
Glucose-Capillary: 127 mg/dL — ABNORMAL HIGH (ref 70–99)
Glucose-Capillary: 179 mg/dL — ABNORMAL HIGH (ref 70–99)
Glucose-Capillary: 187 mg/dL — ABNORMAL HIGH (ref 70–99)

## 2012-08-28 SURGERY — GASTRIC BANDING, LAPAROSCOPIC
Anesthesia: General | Site: Abdomen | Wound class: Clean

## 2012-08-28 MED ORDER — PROMETHAZINE HCL 25 MG/ML IJ SOLN: 12.5000 mg | Freq: Once | INTRAMUSCULAR | Status: DC

## 2012-08-28 MED ORDER — OXYCODONE-ACETAMINOPHEN 5-325 MG/5ML PO SOLN: 5.0000 mL | ORAL | Status: DC | PRN

## 2012-08-28 MED ORDER — SCOPOLAMINE 1 MG/3DAYS TD PT72
1.0000 | MEDICATED_PATCH | TRANSDERMAL | Status: DC
  Administered 2012-08-28: 1.5 mg via TRANSDERMAL
  Filled 2012-08-28 (×2): qty 1

## 2012-08-28 MED ORDER — HYDROMORPHONE HCL PF 1 MG/ML IJ SOLN
INTRAMUSCULAR | Status: AC
  Filled 2012-08-28: qty 1

## 2012-08-28 MED ORDER — PROMETHAZINE HCL 25 MG/ML IJ SOLN: 25.0000 mg | Freq: Four times a day (QID) | INTRAMUSCULAR | Status: DC | PRN

## 2012-08-28 MED ORDER — CEFAZOLIN SODIUM-DEXTROSE 2-3 GM-% IV SOLR
INTRAVENOUS | Status: AC
  Filled 2012-08-28: qty 50

## 2012-08-28 MED ORDER — ACETAMINOPHEN 160 MG/5ML PO SOLN
650.0000 mg | ORAL | Status: DC | PRN
  Filled 2012-08-28: qty 20.3

## 2012-08-28 MED ORDER — ONDANSETRON HCL 4 MG/2ML IJ SOLN
INTRAMUSCULAR | Status: DC | PRN
  Administered 2012-08-28 (×2): 2 mg via INTRAVENOUS

## 2012-08-28 MED ORDER — EPHEDRINE SULFATE 50 MG/ML IJ SOLN
INTRAMUSCULAR | Status: DC | PRN
  Administered 2012-08-28: 10 mg via INTRAVENOUS
  Administered 2012-08-28: 5 mg via INTRAVENOUS

## 2012-08-28 MED ORDER — NEOSTIGMINE METHYLSULFATE 1 MG/ML IJ SOLN
INTRAMUSCULAR | Status: DC | PRN
  Administered 2012-08-28: 5 mg via INTRAVENOUS

## 2012-08-28 MED ORDER — SUCCINYLCHOLINE CHLORIDE 20 MG/ML IJ SOLN
INTRAMUSCULAR | Status: DC | PRN
  Administered 2012-08-28: 40 mg via INTRAVENOUS
  Administered 2012-08-28: 100 mg via INTRAVENOUS

## 2012-08-28 MED ORDER — ACETAMINOPHEN 160 MG/5ML PO SOLN
650.0000 mg | ORAL | Status: DC | PRN
  Administered 2012-08-28 – 2012-08-29 (×3): 650 mg via ORAL
  Filled 2012-08-28 (×3): qty 20.3

## 2012-08-28 MED ORDER — HEPARIN SODIUM (PORCINE) 5000 UNIT/ML IJ SOLN
5000.0000 [IU] | Freq: Once | INTRAMUSCULAR | Status: AC
  Administered 2012-08-28: 5000 [IU] via SUBCUTANEOUS
  Filled 2012-08-28: qty 1

## 2012-08-28 MED ORDER — DEXTROSE 5 % IV SOLN: 3.0000 g | INTRAVENOUS | Status: DC

## 2012-08-28 MED ORDER — MORPHINE SULFATE 2 MG/ML IJ SOLN: 2.0000 mg | INTRAMUSCULAR | Status: DC | PRN

## 2012-08-28 MED ORDER — CIPROFLOXACIN IN D5W 400 MG/200ML IV SOLN
INTRAVENOUS | Status: DC | PRN
  Administered 2012-08-28: 400 mg via INTRAVENOUS

## 2012-08-28 MED ORDER — ONDANSETRON HCL 4 MG/2ML IJ SOLN: 4.0000 mg | Freq: Four times a day (QID) | INTRAMUSCULAR | Status: DC | PRN

## 2012-08-28 MED ORDER — ACETAMINOPHEN 10 MG/ML IV SOLN
INTRAVENOUS | Status: DC | PRN
  Administered 2012-08-28: 1000 mg via INTRAVENOUS

## 2012-08-28 MED ORDER — FENTANYL CITRATE 0.05 MG/ML IJ SOLN
INTRAMUSCULAR | Status: DC | PRN
  Administered 2012-08-28: 25 ug via INTRAVENOUS
  Administered 2012-08-28 (×2): 50 ug via INTRAVENOUS
  Administered 2012-08-28: 25 ug via INTRAVENOUS
  Administered 2012-08-28: 50 ug via INTRAVENOUS

## 2012-08-28 MED ORDER — UNJURY CHOCOLATE CLASSIC POWDER: 2.0000 [oz_av] | Freq: Four times a day (QID) | ORAL | Status: DC

## 2012-08-28 MED ORDER — LIDOCAINE HCL (CARDIAC) 20 MG/ML IV SOLN
INTRAVENOUS | Status: DC | PRN
  Administered 2012-08-28: 100 mg via INTRAVENOUS

## 2012-08-28 MED ORDER — HYDROMORPHONE HCL PF 1 MG/ML IJ SOLN
0.2500 mg | INTRAMUSCULAR | Status: DC | PRN
  Administered 2012-08-28: 0.5 mg via INTRAVENOUS

## 2012-08-28 MED ORDER — POTASSIUM CHLORIDE 2 MEQ/ML IV SOLN
INTRAVENOUS | Status: DC
  Administered 2012-08-28 – 2012-08-29 (×2): via INTRAVENOUS
  Filled 2012-08-28 (×4): qty 1000

## 2012-08-28 MED ORDER — SODIUM CHLORIDE 0.9 % IJ SOLN
INTRAMUSCULAR | Status: DC | PRN
  Administered 2012-08-28: 20 mL

## 2012-08-28 MED ORDER — ROCURONIUM BROMIDE 100 MG/10ML IV SOLN
INTRAVENOUS | Status: DC | PRN
  Administered 2012-08-28: 30 mg via INTRAVENOUS

## 2012-08-28 MED ORDER — MORPHINE SULFATE 10 MG/ML IJ SOLN: 2.0000 mg | INTRAMUSCULAR | Status: DC | PRN

## 2012-08-28 MED ORDER — BUPIVACAINE-EPINEPHRINE 0.5% -1:200000 IJ SOLN
INTRAMUSCULAR | Status: AC
  Filled 2012-08-28: qty 1

## 2012-08-28 MED ORDER — INSULIN ASPART 100 UNIT/ML ~~LOC~~ SOLN
0.0000 [IU] | SUBCUTANEOUS | Status: DC
  Administered 2012-08-28 (×2): 4 [IU] via SUBCUTANEOUS
  Administered 2012-08-29 (×3): 3 [IU] via SUBCUTANEOUS

## 2012-08-28 MED ORDER — LACTATED RINGERS IV SOLN
INTRAVENOUS | Status: DC | PRN
  Administered 2012-08-28: 07:00:00 via INTRAVENOUS

## 2012-08-28 MED ORDER — UNJURY VANILLA POWDER
2.0000 [oz_av] | Freq: Four times a day (QID) | ORAL | Status: DC
  Administered 2012-08-29: 2 [oz_av] via ORAL

## 2012-08-28 MED ORDER — PROMETHAZINE HCL 25 MG/ML IJ SOLN
12.5000 mg | INTRAMUSCULAR | Status: DC | PRN
Start: 2012-08-28 — End: 2012-08-28
  Administered 2012-08-28: 12.5 mg via INTRAVENOUS

## 2012-08-28 MED ORDER — CEFAZOLIN SODIUM 1-5 GM-% IV SOLN
INTRAVENOUS | Status: AC
  Filled 2012-08-28: qty 50

## 2012-08-28 MED ORDER — PROMETHAZINE HCL 25 MG/ML IJ SOLN
INTRAMUSCULAR | Status: AC
  Filled 2012-08-28: qty 1

## 2012-08-28 MED ORDER — GLYCOPYRROLATE 0.2 MG/ML IJ SOLN
INTRAMUSCULAR | Status: DC | PRN
  Administered 2012-08-28: 0.6 mg via INTRAVENOUS

## 2012-08-28 MED ORDER — CIPROFLOXACIN IN D5W 400 MG/200ML IV SOLN
INTRAVENOUS | Status: AC
  Filled 2012-08-28: qty 200

## 2012-08-28 MED ORDER — UNJURY CHICKEN SOUP POWDER: 2.0000 [oz_av] | Freq: Four times a day (QID) | ORAL | Status: DC

## 2012-08-28 MED ORDER — PROPOFOL 10 MG/ML IV BOLUS
INTRAVENOUS | Status: DC | PRN
  Administered 2012-08-28: 200 mg via INTRAVENOUS

## 2012-08-28 MED ORDER — ONDANSETRON HCL 4 MG/2ML IJ SOLN: 4.0000 mg | INTRAMUSCULAR | Status: DC | PRN

## 2012-08-28 MED ORDER — BUPIVACAINE-EPINEPHRINE PF 0.5-1:200000 % IJ SOLN
INTRAMUSCULAR | Status: DC | PRN
  Administered 2012-08-28: 50 mL

## 2012-08-28 MED ORDER — LACTATED RINGERS IV SOLN: INTRAVENOUS | Status: DC

## 2012-08-28 MED ORDER — MIDAZOLAM HCL 5 MG/5ML IJ SOLN
INTRAMUSCULAR | Status: DC | PRN
  Administered 2012-08-28: 2 mg via INTRAVENOUS

## 2012-08-28 MED ORDER — ACETAMINOPHEN 10 MG/ML IV SOLN
INTRAVENOUS | Status: AC
  Filled 2012-08-28: qty 100

## 2012-08-28 SURGICAL SUPPLY — 58 items
BAND LAP 10.0 W/TUBES (Band) ×2 IMPLANT
BENZOIN TINCTURE PRP APPL 2/3 (GAUZE/BANDAGES/DRESSINGS) IMPLANT
BLADE HEX COATED 2.75 (ELECTRODE) ×2 IMPLANT
BLADE SURG 15 STRL LF DISP TIS (BLADE) ×1 IMPLANT
BLADE SURG 15 STRL SS (BLADE) ×1
CANISTER SUCTION 2500CC (MISCELLANEOUS) ×2 IMPLANT
CHLORAPREP W/TINT 26ML (MISCELLANEOUS) ×2 IMPLANT
CLOTH BEACON ORANGE TIMEOUT ST (SAFETY) ×2 IMPLANT
DECANTER SPIKE VIAL GLASS SM (MISCELLANEOUS) ×2 IMPLANT
DERMABOND ADVANCED (GAUZE/BANDAGES/DRESSINGS) ×1
DERMABOND ADVANCED .7 DNX12 (GAUZE/BANDAGES/DRESSINGS) ×1 IMPLANT
DEVICE SUT QUICK LOAD TK 5 (STAPLE) ×6 IMPLANT
DEVICE SUT TI-KNOT TK 5X26 (MISCELLANEOUS) ×2 IMPLANT
DEVICE SUTURE ENDOST 10MM (ENDOMECHANICALS) IMPLANT
DISSECTOR BLUNT TIP ENDO 5MM (MISCELLANEOUS) IMPLANT
DRAPE CAMERA CLOSED 9X96 (DRAPES) ×2 IMPLANT
ELECT REM PT RETURN 9FT ADLT (ELECTROSURGICAL) ×2
ELECTRODE REM PT RTRN 9FT ADLT (ELECTROSURGICAL) ×1 IMPLANT
GLOVE BIOGEL PI IND STRL 7.0 (GLOVE) ×1 IMPLANT
GLOVE BIOGEL PI INDICATOR 7.0 (GLOVE) ×1
GLOVE SURG SIGNA 7.5 PF LTX (GLOVE) ×2 IMPLANT
GOWN STRL NON-REIN LRG LVL3 (GOWN DISPOSABLE) ×4 IMPLANT
GOWN STRL REIN XL XLG (GOWN DISPOSABLE) ×4 IMPLANT
HOVERMATT SINGLE USE (MISCELLANEOUS) ×2 IMPLANT
KIT BASIN OR (CUSTOM PROCEDURE TRAY) ×2 IMPLANT
MESH HERNIA 1X4 RECT BARD (Mesh General) ×1 IMPLANT
MESH HERNIA BARD 1X4 (Mesh General) ×1 IMPLANT
NEEDLE SPNL 22GX3.5 QUINCKE BK (NEEDLE) ×2 IMPLANT
NS IRRIG 1000ML POUR BTL (IV SOLUTION) ×2 IMPLANT
PACK UNIVERSAL I (CUSTOM PROCEDURE TRAY) ×2 IMPLANT
PENCIL BUTTON HOLSTER BLD 10FT (ELECTRODE) ×2 IMPLANT
SCALPEL HARMONIC ACE (MISCELLANEOUS) IMPLANT
SET IRRIG TUBING LAPAROSCOPIC (IRRIGATION / IRRIGATOR) IMPLANT
SLEEVE Z-THREAD 5X100MM (TROCAR) IMPLANT
SOLUTION ANTI FOG 6CC (MISCELLANEOUS) ×2 IMPLANT
SPONGE LAP 18X18 X RAY DECT (DISPOSABLE) ×2 IMPLANT
STAPLER VISISTAT 35W (STAPLE) ×2 IMPLANT
STRIP CLOSURE SKIN 1/2X4 (GAUZE/BANDAGES/DRESSINGS) IMPLANT
SUT ETHIBOND 2 0 SH (SUTURE) ×3
SUT ETHIBOND 2 0 SH 36X2 (SUTURE) ×3 IMPLANT
SUT PROLENE 2 0 CT2 30 (SUTURE) ×2 IMPLANT
SUT SILK 0 (SUTURE) ×1
SUT SILK 0 30XBRD TIE 6 (SUTURE) ×1 IMPLANT
SUT SURGIDAC NAB ES-9 0 48 120 (SUTURE) IMPLANT
SUT VIC AB 2-0 SH 27 (SUTURE) ×1
SUT VIC AB 2-0 SH 27X BRD (SUTURE) ×1 IMPLANT
SUT VIC AB 5-0 PS2 18 (SUTURE) ×4 IMPLANT
SYR 20CC LL (SYRINGE) ×2 IMPLANT
SYR CONTROL 10ML LL (SYRINGE) ×2 IMPLANT
SYS KII OPTICAL ACCESS 15MM (TROCAR) ×2
SYSTEM KII OPTICAL ACCESS 15MM (TROCAR) ×1 IMPLANT
TOWEL OR 17X26 10 PK STRL BLUE (TOWEL DISPOSABLE) ×2 IMPLANT
TROCAR XCEL NON-BLD 11X100MML (ENDOMECHANICALS) ×2 IMPLANT
TROCAR Z-THREAD FIOS 11X100 BL (TROCAR) ×2 IMPLANT
TROCAR Z-THREAD FIOS 5X100MM (TROCAR) ×2 IMPLANT
TROCAR Z-THREAD SLEEVE 11X100 (TROCAR) ×2 IMPLANT
TUBE CALIBRATION LAPBAND (TUBING) ×2 IMPLANT
TUBING INSUFFLATION 10FT LAP (TUBING) ×2 IMPLANT

## 2012-08-28 NOTE — Progress Notes (Signed)
Lap Band placement confirmed by X-Ray WDL.

## 2012-08-28 NOTE — Progress Notes (Signed)
Report given to [facility] for room 1524.

## 2012-08-28 NOTE — H&P (View-Only) (Signed)
 Re:   Megan Gomez DOB:   07/01/1961 MRN:   [id number]  ASSESSMENT AND PLAN: 1.  Morbid obesity, weight - 219, BMI -38.8.  Per the 1991 NIH Consensus Statement, the patient is a candidate for bariatric surgery.  The patient attended our information session and reviewed the different types of bariatric surgery.    The patient is interested in the laparoscopic adjustable gastric band.  I discussed with the patient the indications and risks of lap band surgery.  The potential risks of surgery include, but are not limited to, bleeding, infection, DVT and PE, slippage and erosion of the band, open surgery, and death.  The patient understands the importance of compliance and long term follow-up with our group after surgery.  She has completed her pre op eval and is scheduled for surgery on 08/28/2012.  She has started her pre op diet and has no significant questions.  She wants to be done as an outpatient and we will decide the day of surgery, but she should be able to go home.  2.  Hypertension.  Since 2008. 3.  Diabetes mellitus.  Oral hypoglycemics only.  Since 2001.  Sees Dr. J. Kerr.  He has outlined a post op plan for her with her diabetes. 4.  Back disease.  History of 3 ruptured disks. 5.  Left kidney stone by US. 6.  Hepatic steatosis by US. 7.  Tertiary esophageal spasms and GERD - essentially asymptomatic on UGI.  No chief complaint on file.  REFERRING PHYSICIAN:  Dr. J Kerr.  HISTORY OF PRESENT ILLNESS: Megan Gomez is a 51 y.o. (DOB: 05/12/1961)  white female whose primary care physician is FULP, CAMMIE, MD (She was seeing Dr. Fulp, but is becoming a new patient of Dr. S. Fry.) and comes to me today for pre op visit for a lap band scheduled 08/28/2012.  She has a sister who had a RYGB in [place].  She has done well.  I went over with her the surgery.  We talked about diabetes and the lap band.  I said that we do encourage patients with severe insulin dependent diabetes  towards the RYGB, but with weight loss, the lap band does help diabetes.  I reviewed with her the model of the lap band and the post op course.  UGI - 02/15/2012 - Marked reflux with tertiary contractions, no HH. US - 02/15/2012 - Hepatic steatosis. Psychiatry - 06/09/2012 - Dr. Lurey She brought paper work with her - Bone density - okay, she is set up for a colonoscopy by Dr. Mann in November 2013, 08/09/2012 - negative mammogram, 9/ 13/2013 - normal pap smear.  She has no history of stomach disease.  No history of liver disease.  No history of gall bladder disease.  No history of pancreas disease.  No history of colon disease.   Past Medical History  Diagnosis Date  . Diabetes mellitus   . Hypertension   . Hyperlipidemia     Current Outpatient Prescriptions  Medication Sig Dispense Refill  . aspirin EC 81 MG tablet Take 81 mg by mouth daily.        . B Complex-C (B-COMPLEX WITH VITAMIN C) tablet Take 1 tablet by mouth daily.      . Biotin (RA BIOTIN) 1000 MCG tablet Take 2,000 mcg by mouth daily.      . Calcium Carbonate-Vitamin D (CALCIUM 600 + D PO) Take 2 tablets by mouth daily.      . glimepiride (AMARYL) 1 MG   tablet Take 0.5 mg by mouth 2 (two) times daily.        . losartan-hydrochlorothiazide (HYZAAR) 100-25 MG per tablet Take 1 tablet by mouth daily before breakfast.       . metFORMIN (GLUCOPHAGE) 1000 MG tablet Take 1,000 mg by mouth 2 (two) times daily with a meal.        . metoprolol tartrate (LOPRESSOR) 25 MG tablet Take 25 mg by mouth 2 (two) times daily.      . Multiple Vitamin (MULTIVITAMIN) capsule Take 1 capsule by mouth daily.      . simvastatin (ZOCOR) 40 MG tablet Take 40 mg by mouth at bedtime.        . DISCONTD: fexofenadine (ALLEGRA) 180 MG tablet Take 180 mg by mouth as needed.       . DISCONTD: mometasone (NASONEX) 50 MCG/ACT nasal spray Place 2 sprays into the nose daily.          Allergies  Allergen Reactions  . Cephalexin Swelling  . Demerol Nausea And  Vomiting  . Stadol (Butorphanol Tartrate) Nausea And Vomiting    REVIEW OF SYSTEMS:  Cardiac:  Hypertension x 5 years. No history of heart disease.  No history of prior cardiac catheterization.    Endocrine:  Diabetes x 10 years.  Sees Dr. J. Kerr. No thyroid disease. Gastrointestinal:  See HPI. GYN:  Irregular periods.  She has two children, 8 and 12, both by C section.  Hematologic:  Legs cramp.  She has 3 disks ruptured in her back. She dislocated her hip in 2011. Psycho-social:  The patient is oriented.   The patient has no obvious psychologic or social impairment to understanding our conversation and plan.  She saw Dr. Lurey for psychiatry.  SOCIAL and FAMILY HISTORY: Married.  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 Her husband works in the micro technology with [employer]. 2 children: 8 and 12 years.  PHYSICAL EXAM: There were no vitals taken for this visit.  General: WN obese WF who is alert and generally healthy appearing.  HEENT: Normal. Pupils equal. Good dentition. Neck: Supple. No mass.  No thyroid mass.   Lymph Nodes:  No supraclavicular or cervical nodes. Lungs: Clear to auscultation and symmetric breath sounds. Heart:  RRR. No murmur or rub.  Abdomen: Soft. No mass. No tenderness. No hernia. Normal bowel sounds.  C section scar.  She is more apple than pear. Extremities:  Good strength and ROM  in upper and lower extremities. Neurologic:  Grossly intact to motor and sensory function. Psychiatric: Has normal mood and affect. Behavior is normal.   DATA REVIEWED: Reports in the HPI> TSH - 1.575 - 04/20/2012.  Shawn Carattini, MD,  FACS [facility] [address]  [address]   [place], [place]    [address] Phone:  [phone] FAX:  [phone]  

## 2012-08-28 NOTE — Transfer of Care (Signed)
Immediate Anesthesia Transfer of Care Note  Patient: Megan Gomez  Procedure(s) Performed: Procedure(s) (LRB) with comments: LAPAROSCOPIC GASTRIC BANDING (N/A)  Patient Location: PACU  Anesthesia Type: General  Level of Consciousness: awake, oriented and patient cooperative  Airway & Oxygen Therapy: Patient Spontanous Breathing and Patient connected to face mask oxygen  Post-op Assessment: Report given to PACU RN, Post -op Vital signs reviewed and stable and Patient moving all extremities X 4  Post vital signs: Reviewed and stable  Complications: No apparent anesthesia complications

## 2012-08-28 NOTE — Interval H&P Note (Signed)
History and Physical Interval Note:  08/28/2012 7:07 AM  Megan Gomez  has presented today for surgery, with the diagnosis of morbid obesity  The various methods of treatment have been discussed with the patient and family.   Husband to get here later today.  Plans to go home today, if all goes well.  Allergy to cephalexin (?), her face swelled in the area of shingles in Nov 2012.  After consideration of risks, benefits and other options for treatment, the patient has consented to  Procedure(s) (LRB) with comments: LAPAROSCOPIC GASTRIC BANDING (N/A) as a surgical intervention .    The patient's history has been reviewed, patient examined, no change in status, stable for surgery.  I have reviewed the patient's chart and labs.  Questions were answered to the patient's satisfaction.     Brax Walen H

## 2012-08-28 NOTE — Op Note (Signed)
08/28/2012  8:55 AM  PATIENT:  Megan Gomez, 51 y.o., female, MRN: [id number]  PREOP DIAGNOSIS:  morbid obesity  POSTOP DIAGNOSIS:   Morbid Obesity, Weight - 219, BMI - 38.8  PROCEDURE:   Laparoscopic adjustable gastric band, AP Standard  SURGEON:   Ovidio Kin, M.D.  Threasa HeadsFredonia Highland, M.D.  ANESTHESIA:   general  Valeda Malm, CRNA - CRNA Gaetano Hawthorne, MD - Anesthesiologist Paris Lore, CRNA - CRNA  General  EBL:  Minimal  ml  BLOOD ADMINISTERED: none  LOCAL MEDICATIONS USED:   40 cc 1/4% marcaine  SPECIMEN:   None  COUNTS CORRECT:  YES  INDICATIONS FOR PROCEDURE:  Obera Marines is a 51 y.o. (DOB: 02-20-1961) white female whose primary care physician is FULP, CAMMIE, MD and comes for adjustable laparoscopic gastric band placement.   The indications and risks of the Lap Band surgery were explained to the patient.  The risks include, but are not limited to, infection, bleeding, bowel injury, and failure to lose weight.  Long term risk include, but are not limited to, slippage and erosion of the Lap Band.  OPERATIVE NOTE:  The patient was taken to room #1 at [facility] for the operation.  General was the supervising anesthesiologist.  The patient's abdomen was prepped with Chloroprep and sterilely draped.  The patient was given Cipro at the beginning of the operation.   A time out was held and the surgical checklist reviewed.   The abdomen was accessed in the LUQ with an 5 mm trocar.  Four additional trocars were placed: a 5 mm trocar in the subxiphoid area for the liver retractor, a 15 mm trocar in the right costal margin, a 12 mm trocar to the right of midline, and a 12 mm trocar to the left of midline.   An abdominal exploration was carried out.  The liver was unremarkable.  The stomach was unremarkable.  The remainder of the bowel, which could be seen, was unremarkable.   I made an incision at the Angle of Hiss and identified the left crus.  I then  opened the gastrohepatic ligament over the caudate lobe of the liver.  I identified the right crus and passed the "finger" dissector behind the gastroesophageal junction.   A test for a hiatal hernia was done using the sizing balloon.  The sizing tube was advanced into the stomach, 15 cc of air was placed in the sizing balloon, and the balloon pulled back to the esophageal hiatus.  There was no evidence of a hiatal hernia.   I then used a AP Standard Lap Band and inserted the silastic tubing through the tip of the finger dissector and pulled the tubing around the proximal stomach.  The sizing tube was passed into the stomach and the lap band closed over the sizing tube.  The sizing tube was removed.   I then imbricated the lateral stomach over the lap band and sewed it to the stomach proximal to the lap band.  I used 2-0 Ethibond sutures with Laparo-tyes to create the gastro-gastric suture.  Photos were taken and placed in the chart.  The lap band and tubing lay in good position.  The tubing was brought through the right lateral trocar site and attached to the lap band reservoir.  The reservoir has a piece of polypropylene mesh sewn on the back to fixate it in the subcutaneous tissues.  I infiltrated Marcaine into each incision for a total of 40 cc.  Each incision was sewn with 5-0 Monocryl sutures and the skin was painted with Dermabond.  The patient tolerated the procedure well and was sent to the recovery room in good condition.  The sponge and needle count were correct at the end of the case.    The plan is to let her go home today.  Ovidio Kin, MD, [facility] Pager: [phone] Office phone:  [phone]

## 2012-08-28 NOTE — Progress Notes (Signed)
Patient has been up and in hallway to BR x 2 tolerated well. Voided x 2. c/o nausea but no heaving or emesis. Tolerated 30 cc ice chips with no emesis. Burping at times and states she is very sleepy, has not slept well for days.

## 2012-08-28 NOTE — Progress Notes (Signed)
NOS note:  Patient had hoped to go home, but too nauseated.  BP 125/77  Pulse 74  Temp 98.3 F (36.8 C)  Resp 16  Wt 212 lb 6 oz (96.333 kg)  SpO2 95%  KUB shows band position good. Will keep overnight and see how she does. Needs to get up and ambulate.  Wounds look good.  Ovidio Kin, MD, [facility] Pager: [phone] Office phone:  [phone]

## 2012-08-28 NOTE — Anesthesia Postprocedure Evaluation (Signed)
  Anesthesia Post-op Note  Patient: Megan Gomez  Procedure(s) Performed: Procedure(s) (LRB): LAPAROSCOPIC GASTRIC BANDING (N/A)  Patient Location: PACU  Anesthesia Type: General  Level of Consciousness: awake and alert   Airway and Oxygen Therapy: Patient Spontanous Breathing  Post-op Pain: mild  Post-op Assessment: Post-op Vital signs reviewed, Patient's Cardiovascular Status Stable, Respiratory Function Stable, Patent Airway and No signs of Nausea or vomiting  Post-op Vital Signs: stable  Complications: No apparent anesthesia complications

## 2012-08-28 NOTE — Anesthesia Procedure Notes (Signed)
Procedure Name: Intubation Date/Time: 08/28/2012 7:49 AM Performed by: Valeda Malm Pre-anesthesia Checklist: Patient identified, Emergency Drugs available, Suction available, Patient being monitored and Timeout performed Patient Re-evaluated:Patient Re-evaluated prior to inductionOxygen Delivery Method: Circle system utilized Preoxygenation: Pre-oxygenation with 100% oxygen Intubation Type: IV induction Ventilation: Mask ventilation without difficulty and Oral airway inserted - appropriate to patient size Laryngoscope Size: Mac and 4 Grade View: Grade I Tube size: 7.0 mm Number of attempts: 1 Airway Equipment and Method: Stylet and Oral airway Placement Confirmation: ETT inserted through vocal cords under direct vision,  positive ETCO2 and breath sounds checked- equal and bilateral Secured at: 22 cm Tube secured with: Tape Dental Injury: Teeth and Oropharynx as per pre-operative assessment

## 2012-08-28 NOTE — Progress Notes (Signed)
Jon Gills at desk side to order Lap band order set. With nurse.

## 2012-08-29 ENCOUNTER — Encounter (HOSPITAL_COMMUNITY): Payer: Self-pay | Admitting: Surgery

## 2012-08-29 LAB — GLUCOSE, CAPILLARY: Glucose-Capillary: 142 mg/dL — ABNORMAL HIGH (ref 70–99)

## 2012-08-29 NOTE — Progress Notes (Signed)
General Surgery Note  LOS: 1 day  POD# 1  Assessment/Plan: 1. LAPAROSCOPIC GASTRIC BANDING - D. Azka Steger - 09-01-12  Feels better today.  Nausea better.  Ready for D/C.  Instructions reviewed.   2. Hypertension.   Since 2008.  3. Diabetes mellitus. Oral hypoglycemics only.   Since 2001.   Sees Dr. Ester Rink. He has outlined a post op plan for her with her diabetes.   Blood sugars up a little.  She is going to check her blood sugars at home and check with Dr. Sharl Ma if there are problems. 4. Back disease.   History of 3 ruptured disks.  5. Left kidney stone by [place].  6. Hepatic steatosis by [place].  7. Tertiary esophageal spasms and GERD - essentially asymptomatic on UGI.  I wonder if this has something to do with her nausea post op.  Subjective:  Doing better.  Ready for D/C. Objective:   Filed Vitals:   08/29/12 0536  BP: 133/81  Pulse: 70  Temp: 98.3 F (36.8 C)  Resp: 16     Intake/Output from previous day:  09-02-2023 0701 - 10/02 0700 In: 4203.8 [P.O.:60; I.V.:4143.8] Out: 2125 [Urine:2100; Blood:25]  Intake/Output this shift:      Physical Exam:   General: WN Obese WF who is alert and oriented.    HEENT: Normal. Pupils equal. .   Lungs: Clear.   Abdomen: Soft.   Wound: Look good.   Neurologic:  Grossly intact to motor and sensory function.   Psychiatric: Has normal mood and affect.   Lab Results:   No results found for this basename: WBC:2,HGB:2,HCT:2,PLT:2 in the last 72 hours  BMET  No results found for this basename: NA:2,K:2,CL:2,CO2:2,GLUCOSE:2,BUN:2,CREATININE:2,CALCIUM:2 in the last 72 hours  PT/INR  No results found for this basename: LABPROT:2,INR:2 in the last 72 hours  ABG  No results found for this basename: PHART:2,PCO2:2,PO2:2,HCO3:2 in the last 72 hours   Studies/Results:  Dg Abd 1 View  01-Sep-2012  *RADIOLOGY REPORT*  Clinical Data: Postop for lap band placement.  ABDOMEN - 1 VIEW  Comparison: Upper GI of 04/13/2012  Findings: Single supine view of  the abdomen and upper pelvis.  Lap band is appropriately positioned, approximately 3 cm below the left hemidiaphragm.  Nonobstructive bowel gas pattern, without evidence of free intraperitoneal air.  Moderate stool within the ascending colon.  IMPRESSION: Appropriate position of the lap band, without acute finding.   Original Report Authenticated By: Consuello Bossier, M.D.      Anti-infectives:   Anti-infectives     Start     Dose/Rate Route Frequency Ordered Stop   Sep 01, 2012 0537   ceFAZolin (ANCEF) 3 g in dextrose 5 % 50 mL IVPB  Status:  Discontinued        3 g 160 mL/hr over 30 Minutes Intravenous 60 min pre-op 2012/09/01 0537 09-01-12 1104          Ovidio Kin, MD, FACS Pager: [phone],   [facility] Office: [phone] 08/29/2012

## 2012-08-29 NOTE — Care Management Note (Signed)
    Page 1 of 1   08/29/2012     10:58:05 AM   CARE MANAGEMENT NOTE 08/29/2012  Patient:  Megan Gomez, Megan Gomez   Account Number:  [id number]  Date Initiated:  08/29/2012  Documentation initiated by:  Lorenda Ishihara  Subjective/Objective Assessment:   51 yo female admitted s/p lap banding, overnight stay due to nausea. PTA lived at home with spouse     Action/Plan:   Anticipated DC Date:  08/29/2012   Anticipated DC Plan:  HOME/SELF CARE      DC Planning Services  CM consult      Choice offered to / List presented to:             Status of service:  Completed, signed off Medicare Important Message given?   (If response is "NO", the following Medicare IM given date fields will be blank) Date Medicare IM given:   Date Additional Medicare IM given:    Discharge Disposition:  HOME/SELF CARE  Per UR Regulation:  Reviewed for med. necessity/level of care/duration of stay  If discussed at Long Length of Stay Meetings, dates discussed:    Comments:

## 2012-08-29 NOTE — Discharge Summary (Signed)
Physician Discharge Summary  Patient ID:  Megan Gomez  MRN: [id number]  DOB/AGE: 1961/01/25 51 y.o.  Admit date: 08/28/2012 Discharge date: 08/29/2012  Discharge Diagnoses: 1. Morbid obesity, weight - 219, BMI -38.8.  LAPAROSCOPIC GASTRIC BANDING - D. Kyrollos Cordell - 08/28/2012   Post op nausea, which has now resolved.  2. Hypertension.   Since 2008.  3. Diabetes mellitus. Oral hypoglycemics only.   Since 2001.   Sees Dr. Ester Rink. He has outlined a post op plan for her with her for her diabetes.  4. Back disease.   History of 3 ruptured disks.  5. Left kidney stone by [place].  6. Hepatic steatosis by [place].  7. Tertiary esophageal spasms and GERD - essentially asymptomatic on UGI.   Operation: Procedure(s): LAPAROSCOPIC GASTRIC BANDING on 08/28/2012  Discharged Condition: good  Hospital Course: Megan Gomez is an 51 y.o. female whose primary care physician is FULP, CAMMIE, MD and who was admitted 08/28/2012 post op lap band.  She had nausea that was not well controlled, so we kept her overnight.  She is morbidly obese and has been through our bariatric program.  She is interested in weight loss surgery.   She was brought to the operating room on 08/28/2012 and underwent placement of a Lap Band.   She stayed in the hospital one night for control of nausea and is now ready to go home  Consults: None  Significant Diagnostic Studies: Results for orders placed during the hospital encounter of 08/28/12  GLUCOSE, CAPILLARY      Component Value Range   Glucose-Capillary 112 (*) 70 - 99 mg/dL  GLUCOSE, CAPILLARY      Component Value Range   Glucose-Capillary 211 (*) 70 - 99 mg/dL   Comment 1 Documented in Chart     Comment 2 Notify RN    GLUCOSE, CAPILLARY      Component Value Range   Glucose-Capillary 187 (*) 70 - 99 mg/dL   Comment 1 Documented in Chart     Comment 2 Notify RN    GLUCOSE, CAPILLARY      Component Value Range   Glucose-Capillary 179 (*) 70 - 99 mg/dL  GLUCOSE,  CAPILLARY      Component Value Range   Glucose-Capillary 127 (*) 70 - 99 mg/dL  GLUCOSE, CAPILLARY      Component Value Range   Glucose-Capillary 144 (*) 70 - 99 mg/dL  GLUCOSE, CAPILLARY      Component Value Range   Glucose-Capillary 142 (*) 70 - 99 mg/dL    Dg Abd 1 View  95/04/2129  *RADIOLOGY REPORT*  Clinical Data: Postop for lap band placement.  ABDOMEN - 1 VIEW  Comparison: Upper GI of 04/13/2012  Findings: Single supine view of the abdomen and upper pelvis.  Lap band is appropriately positioned, approximately 3 cm below the left hemidiaphragm.  Nonobstructive bowel gas pattern, without evidence of free intraperitoneal air.  Moderate stool within the ascending colon.  IMPRESSION: Appropriate position of the lap band, without acute finding.   Original Report Authenticated By: Consuello Bossier, M.D.     Discharge Exam:  Filed Vitals:   08/29/12 0536  BP: 133/81  Pulse: 70  Temp: 98.3 F (36.8 C)  Resp: 16    General: WN Obese WF who is alert.  Lungs: Clear to auscultation and symmetric breath sounds. Heart:  RRR. No murmur or rub. Abdomen: Soft. Her incisions look good.  Discharge Medications:   Roxicet    Medication List  As of 08/29/2012  9:12 AM    STOP taking these medications         aspirin EC 81 MG tablet      ibuprofen 200 MG tablet   Commonly known as: ADVIL,MOTRIN      TAKE these medications         acetaminophen 500 MG tablet   Commonly known as: TYLENOL   Take 500 mg by mouth every 6 (six) hours as needed.      B-complex with vitamin C tablet   Take 1 tablet by mouth daily.      CALCIUM 600 + D PO   Take 2 tablets by mouth daily.      glimepiride 1 MG tablet   Commonly known as: AMARYL   Take 0.5 mg by mouth 2 (two) times daily.      losartan-hydrochlorothiazide 100-25 MG per tablet   Commonly known as: HYZAAR   Take 1 tablet by mouth daily before breakfast.      metFORMIN 1000 MG tablet   Commonly known as: GLUCOPHAGE   Take 1,000  mg by mouth 2 (two) times daily with a meal.      metoprolol succinate 50 MG 24 hr tablet   Commonly known as: TOPROL-XL   Take 50 mg by mouth. Take with or immediately following a meal. stoppping 08/28/2012      metoprolol tartrate 25 MG tablet   Commonly known as: LOPRESSOR   Take 25 mg by mouth 2 (two) times daily.      multivitamin capsule   Take 1 capsule by mouth daily.      RA BIOTIN 1000 MCG tablet   Generic drug: Biotin   Take 2,000 mcg by mouth daily.      simvastatin 40 MG tablet   Commonly known as: ZOCOR   Take 40 mg by mouth at bedtime.        Disposition: 06-Home-Health Care Svc  Discharge Instructions: Activity:  Driving - If doing well, may drive in 2 or three days.   Lifting - No lifting > 15 pounds for 1 week, then no limit.  Wound Care:   Leave incisions dry for 24 hours (till Thurs), then may shower.  Diet:  Post op lap band diet.  Nutritionist to advance diet.  Follow up appointment:  Call Dr. Allene Pyo office [facility]) at [phone] for an appointment in about 2 weeks (you should already have an appt)  Medications and dosages:  Resume your home medications.  You have a prescription for:  Roxicet elixir  Signed: Ovidio Kin, M.D., FACS  08/29/2012, 9:12 AM

## 2012-08-29 NOTE — Progress Notes (Signed)
Pt alert and oriented; VSS; CBG's 127-187;denies any nausea or vomiting; tolerating water well; will advance to protein shakes; +flatus; denies BM;voiding without difficulty; c/o some abdominal discomfort when moving; using prn meds with relief; ambulating in hallways without difficulty; using incentive spirometer; pt has follow up appts with [facility] and Dr. Ezzard Standing; aware of support group and BELT program; discharge instructions reviewed and pt verbalized understanding of; questions answered. ADJUSTABLE GASTRIC BAND DISCHARGE INSTRUCTIONS  Drs. Fredrik Rigger, [place], [place], and [place] Call if you have any problems.   Call [phone] and ask for the surgeon on call.    If you need immediate assistance come to the ER at [facility]. Tell the ER personnel that you are a new post-op gastric banding patient. Signs and symptoms to report:   Severe vomiting or nausea. If you cannot tolerate clear liquids for longer than 1 day, you need to call your surgeon.    Abdominal pain which does not get better after taking your pain medication   Fever greater than 101 F degree   Difficulty breathing   Chest pain    Redness, swelling, drainage, or foul odor at incision sites    If your incisions open or pull apart   Swelling or pain in calf (lower leg)   Diarrhea, frequent watery, uncontrolled bowel movements.   Constipation, (no bowel movements for 3 days) if this occurs, Take Milk of Magnesia, 2 tablespoons by mouth, 3 times a day for 2 days if needed.  Call your doctor if constipation continues. Stop taking Milk of Magnesia once you have had a bowel movement. You may also use Miralax according to the label instructions.   Anything you consider "abnormal for you".   Normal side effects after Surgery:   Unable to sleep at night or concentrate   Irritability   Being tearful (crying) or depressed   These are common complaints, possibly related to your anesthesia, stress of surgery and change in lifestyle,  that usually go away a few weeks after surgery.  If these feelings continue, call your medical doctor.  Wound Care You may have surgical glue, steri-strips, or staples over your incisions after surgery.  Surgical glue:  Looks like a clear film over your incisions and will wear off gradually. Steri-strips: Strips of tape over your incisions. You may notice a yellowish color on the skin underneath the steri-strips. This is a substance used to make the steri-strips stick better. Do not pull the steri-strips off - let them fall off. Staples: Cherlynn Polo may be removed before you leave the hospital. If you go home with staples, call [facility] [phone]) for an appointment with your surgeon's nurse to have staples removed in 7 - 10 days. Showering: You may shower two days after your surgery unless otherwise instructed by your surgeon. Wash gently around wounds with warm soapy water, rinse well, and gently pat dry.  If you have a drain, you may need someone to hold this while you shower. Avoid tub baths until staples are removed and incisions are healed.    Medications   Medications should be liquid or crushed if larger than the size of a dime.  Extended release pills should not be crushed.   Depending on the size and number of medications you take, you may need to stagger/change the time you take your medications so that you do not over-fill your pouch.    Make sure you follow-up with your primary care physician to make medication adjustments needed during rapid  weight loss and life-style adjustment.   If you are diabetic, follow up with the doctor that prescribes your diabetes medication(s) within one week after surgery and check your blood sugar regularly.   Do not drive while taking narcotics!   Do not take acetaminophen (Tylenol) and Roxicet or Lortab Elixir at the same time since these pain medications contain acetaminophen.  Diet at home: (First 2 Weeks)  You will see the  nutritionist two weeks after your surgery. She will advance your diet if you are tolerating liquids well. Once at home, if you have severe vomiting or nausea and cannot tolerate clear liquids lasting longer than 1 day, call your surgeon.  For Same Day Surgery Discharge Patients: The day of surgery drink water only: 2 ounces every 4 hours. If you are tolerating water, begin drinking your high protein shake the next morning. For Overnight Stay Patients: Begin high protein shake 2 ounces every 3 hours, 5 - 6 times per day.  Gradually increase the amount you drink as tolerated.  You may find it easier to slowly sip shakes throughout the day.  It is important to get your proteins in first.   Protein Shake   Drink at least 2 ounces of shake 5-6 times per day   Each serving of protein shakes should have a minimum of 15 grams of protein and no more than 5 grams of carbohydrate    Increase the amount of protein shake you drink as tolerated   Protein powder may be added to fluids such as non-fat milk or Lactaid milk (limit to 20 grams added protein powder per serving   The initial goal is to drink at least 8 ounces of protein shake/drink per day (or as directed by the nutritionist). Some examples of protein shakes are [employer], [employer]illard[employer], EAS Edge HP, and Unjury. Hydration   Gradually increase the amount of water and other liquids as tolerated (See Acceptable Fluids)   Gradually increase the amount of protein shake as tolerated     Sip fluids slowly and throughout the day   May use Sugar substitutes, use sparingly (limit to 6 - 8 packets per day).  Your fluid goal is 64 ounces of fluid daily. It may take a few weeks to build up to this.         32 oz (or more) should be clear liquids and 32 oz (or more) should be full liquids.         Liquids should not contain sugar, caffeine, or carbonation!  Acceptable Fluids Clear Liquids:   Water or Sugar-free flavored water, Fruit H2O   Decaffeinated  coffee or tea (sugar-free)   Crystal Lite, Wyler's Lite, Minute Maid Lite   Sugar-free Jell-O   Bouillon or broth   Sugar-free Popsicle:   *Less than 20 calories each; Limit 1 per day   Full Liquids:              Protein Shakes/Drinks + 2 choices per day of other full liquids shown below.    Other full liquids must be: No more than 12 grams of Carbs per serving,  No more than 3 grams of Fat per serving   Strained low-fat cream soup   Non-Fat milk   Fat-free Lactaid Milk   Sugar-free yogurt (Dannon Lite & Fit) Vitamins and Minerals (Start 1 day after surgery unless otherwise directed)   1 Chewable Multivitamin / Multimineral Supplement (i.e. Centrum for Adults)   Chewable Calcium Citrate with Vitamin D-3. Take  1500 mg each day.           (Example: 3 Chewable Calcium Plus 600 with Vitamin D-3 can be found at [facility])           Do not mix multivitamins containing iron with calcium supplements; take 2 hours   apart   Do not substitute Tums (calcium carbonate) for your calcium   Menstruating women and those at risk for anemia may need extra iron. Talk with your doctor to see if you need additional iron.     If you need extra iron:  Total daily Iron recommendations (including Vitamins) = 50 - 100 mg Iron/day Do not stop taking or change any vitamins or minerals until you talk to your nutritionist or surgeon. Your nutritionist and / or physician must approve all vitamin and mineral supplements. Exercise For maximum success, begin exercising as soon as your doctor recommends. Make sure your physician approves any physical activity.   Depending on fitness level, begin with a simple walking program   Walk 5-15 minutes each day, 7 days per week.    Slowly increase until you are walking 30-45 minutes per day   Consider joining our BELT program. [phone] or email belt@[employer] .edu Things to remember:   You may have sexual relations when you feel comfortable. It is VERY important for female patients  to use a reliable birth control method. Fertility often increases after surgery. Do not get pregnant for at least 18 months.   It is very important to keep all follow up appointments with your surgeon, nutritionist, primary care physician, and behavioral health practitioner. After the first year, please follow up with your bariatric surgeon at least once a year in order to maintain best weight loss results.  [facility]: [phone] [facility]: [phone]   Free counseling is available for you and your family through collaboration between [facility] and [place]. Please call [phone] and leave a message.    Consider purchasing a medical alert bracelet that says you had lap-band surgery.    The [facility] has a free Bariatric Surgery Support Group that meets monthly, the 3rd Thursday, 6 pm, Classroom #1, [place]. You may register online at [email or web address], but registration is not necessary. Select Classes and Support Groups, Bariatric Surgery, or Call [phone]   Do not return to work or drive until cleared by your surgeon   Use your CPAP when sleeping if applicable   Do not lift anything greater than ten pounds for at least two weeks.   You will probably have your first fill (fluid added to your band) 6 weeks after surgery  Talmadge Chad, RN Bariatric Nurse coordinator

## 2012-09-11 ENCOUNTER — Encounter: Payer: Federal, State, Local not specified - PPO | Attending: Surgery | Admitting: *Deleted

## 2012-09-11 DIAGNOSIS — Z713 Dietary counseling and surveillance: Secondary | ICD-10-CM | POA: Insufficient documentation

## 2012-09-11 DIAGNOSIS — Z01818 Encounter for other preprocedural examination: Secondary | ICD-10-CM | POA: Insufficient documentation

## 2012-09-12 ENCOUNTER — Ambulatory Visit (INDEPENDENT_AMBULATORY_CARE_PROVIDER_SITE_OTHER): Payer: Federal, State, Local not specified - PPO | Admitting: Surgery

## 2012-09-12 VITALS — BP 130/82 | HR 72 | Resp 18 | Ht 63.0 in | Wt 201.6 lb

## 2012-09-12 DIAGNOSIS — Z9884 Bariatric surgery status: Secondary | ICD-10-CM

## 2012-09-12 NOTE — Progress Notes (Signed)
Re:   Megan Gomez DOB:   [birth year] MRN:   [id number]  ASSESSMENT AND PLAN: 1.  S/P Lap Band, APS - 08/28/2012 - D. Veneta Sliter  Morbid obesity, weight - 219, BMI -38.8.  She has lost about 18 pounds since surgery.  She just started reg food yesterday.  I'll see her back in 6 weeks to decide whether she needs a band fill.   2.  Hypertension.  Since 2008. 3.  Diabetes mellitus.  Oral hypoglycemics only.  Since 2001.  Sees Dr. Ester Rink.  He has outlined a post op plan for her with her diabetes. 4.  Back disease.  History of 3 ruptured disks. 5.  Left kidney stone by [place]. 6.  Hepatic steatosis by [place]. 7.  Tertiary esophageal spasms and GERD - essentially asymptomatic on UGI.  Chief Complaint  Patient presents with  . Bariatric Follow Up    postop lap band   REFERRING PHYSICIAN:  Dr. Ester Rink.  HISTORY OF PRESENT ILLNESS: Megan Gomez is a 51 y.o. (DOB: 12/01/1960)  white female whose primary care physician is FULP, CAMMIE, MD .  She comes for her post op visit for a lap band - 08/28/2012.  She has a sister who had a RYGB in [place].  She has done well.  This is her first post op visit.   She has done well.  No nausea or vomiting.  No pain.  She is walking.   Past Medical History  Diagnosis Date  . Diabetes mellitus   . Hypertension   . Hyperlipidemia     Current Outpatient Prescriptions  Medication Sig Dispense Refill  . acetaminophen (TYLENOL) 500 MG tablet Take 500 mg by mouth every 6 (six) hours as needed.      . B Complex-C (B-COMPLEX WITH VITAMIN C) tablet Take 1 tablet by mouth daily.      . Biotin (RA BIOTIN) 1000 MCG tablet Take 2,000 mcg by mouth daily.      Marland Kitchen glimepiride (AMARYL) 1 MG tablet Take 0.5 mg by mouth 2 (two) times daily.        Marland Kitchen losartan-hydrochlorothiazide (HYZAAR) 100-25 MG per tablet Take 1 tablet by mouth daily before breakfast.       . metFORMIN (GLUCOPHAGE) 1000 MG tablet Take 1,000 mg by mouth 2 (two) times daily with a meal.       .  metoprolol succinate (TOPROL-XL) 50 MG 24 hr tablet Take 50 mg by mouth. Take with or immediately following a meal. stoppping 08/28/2012      . metoprolol tartrate (LOPRESSOR) 25 MG tablet Take 25 mg by mouth 2 (two) times daily.      . Multiple Vitamin (MULTIVITAMIN) capsule Take 1 capsule by mouth daily.      . simvastatin (ZOCOR) 40 MG tablet Take 40 mg by mouth at bedtime.       Marland Kitchen DISCONTD: fexofenadine (ALLEGRA) 180 MG tablet Take 180 mg by mouth as needed.       Marland Kitchen DISCONTD: mometasone (NASONEX) 50 MCG/ACT nasal spray Place 2 sprays into the nose daily.          Allergies  Allergen Reactions  . Cephalexin Swelling  . Chlorhexidine Gluconate Itching  . Demerol Nausea And Vomiting  . Stadol (Butorphanol Tartrate) Nausea And Vomiting    REVIEW OF SYSTEMS:  Cardiac:  Hypertension x 5 years. No history of heart disease.  No history of prior cardiac catheterization.    Endocrine:  Diabetes x 10 years.  Sees Dr.  J. Sharl Ma. No thyroid disease. Gastrointestinal:  She is set up for a colonoscopy by Dr. Loreta Ave in November 2013. GYN:  Irregular periods.  She has two children, 8 and 12, both by C section.  Hematologic:  Legs cramp.  She has 3 disks ruptured in her back. She dislocated her hip in 2011. Psycho-social:  The patient is oriented.   The patient has no obvious psychologic or social impairment to understanding our conversation and plan.  She sees Dr. Cyndia Skeeters for psychiatry.  SOCIAL and FAMILY HISTORY: Married.  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 2 children: 8 and 12 years.  PHYSICAL EXAM: BP 130/82  Pulse 72  Resp 18  Ht 5\' 3"  (1.6 m)  Wt 201 lb 9.6 oz (91.445 kg)  BMI 35.71 kg/m2  LMP 04/03/2012  General: WN obese WF who is alert and generally healthy appearing.  Abdomen: Soft. No mass. Incisions are healing.  Wound looks good.  DATA REVIEWED: None new.  Ovidio Kin, MD,  [facility] [address] [place]  [address]   [place], [place]    [address] Phone:  [phone] FAX:  [phone]

## 2012-09-15 ENCOUNTER — Encounter: Payer: Self-pay | Admitting: *Deleted

## 2012-09-15 NOTE — Progress Notes (Signed)
  Bariatric Class:  Appt start time: 1600 end time:  1700.  2 Week Post-Operative Nutrition Class  Patient was seen on 09/11/12 for Post-[facility]ative [facility]   Surgery date: 08/28/12 Surgery type: LAGB Start weight at [facility]: 222.0 lbs (03/28/12)  Weight today: 202.5 lbs Weight change: 19.5 lbs Total weight lost: 19.5 lbs BMI: 35.9  The following the learning objective met the patient during this course:   Identifies Phase 3A (Soft, High Proteins) Dietary Goals and will begin from 2 weeks post-operatively to 2 months post-operatively  Identifies appropriate sources of fluids and proteins   States protein recommendations and appropriate sources post-operatively  Identifies the need for appropriate texture modifications, mastication, and bite sizes when consuming solids  Identifies appropriate multivitamin and calcium sources post-operatively  Describes the need for physical activity post-operatively and will follow MD recommendations  States when to call healthcare provider regarding medication questions or post-operative complications  Handouts given during class include:  Phase 3A: Soft, High Protein Diet Handout  Band Fill Guidelines Handout  Follow-Up Plan: Patient will follow-up at [facility] in 6 weeks for 2 months post-op nutrition visit for diet advancement per MD.

## 2012-09-15 NOTE — Patient Instructions (Signed)
Patient to follow Phase 3A-Soft, High Protein Diet and follow-up at [facility] in 6 weeks for 2 months post-op nutrition visit for diet advancement. 

## 2012-10-23 ENCOUNTER — Encounter: Payer: Federal, State, Local not specified - PPO | Attending: Surgery | Admitting: *Deleted

## 2012-10-23 ENCOUNTER — Encounter: Payer: Self-pay | Admitting: *Deleted

## 2012-10-23 DIAGNOSIS — Z01818 Encounter for other preprocedural examination: Secondary | ICD-10-CM | POA: Insufficient documentation

## 2012-10-23 DIAGNOSIS — Z713 Dietary counseling and surveillance: Secondary | ICD-10-CM | POA: Insufficient documentation

## 2012-10-23 NOTE — Progress Notes (Addendum)
  Follow-up visit:  8 Weeks Post-Operative LAGB Surgery  Medical Nutrition Therapy:  Appt start time: 0930 end time:  1000.  Primary concerns today: Post-operative Bariatric Surgery Nutrition Management. Megan Gomez returns today for 2 mo f/u with an additional 11 lb wt loss. Total loss of 30.5 lbs since assessment. Reports maintaining small portions at meals and taking supplements regularly.  Has not exercised in last 3 weeks d/t fall and soreness.  Just now feeling better and plans to resume.   Surgery date: 08/28/12 Surgery type: LAGB Start weight at [facility]: 222.0 lbs (03/28/12)  Weight today: 191.5 lbs Weight change: 11.0 lbs Total weight lost: 30.5 lbs BMI: 33.9 kg/m^2  TANITA  BODY COMP RESULTS  03/28/12 09/11/12 10/23/12   Fat Mass (lbs) 101.0 92.0 85.0   Fat Free Mass (lbs) 121.0 110.5 106.5   Total Body Water (lbs) 88.5 81.0 78.0   24-hr recall: B (AM): 1 egg or 2 pcs [place] bacon (8-15g) Snk (AM): 2 oz roast beef and cheese stick (15-20g) L (PM): same as snack (15-20g) Snk (PM): SF pudding   D (PM): same as lunch (15-20g) Snk (PM): None  Fluid intake:  Decaf coffee (3-4 cups), water 24 oz Estimated total protein intake: 50-75g  Medications: Off all DM meds Supplementation: Taking as directed  CBG monitoring: Daily (replaced patient's broken meter) Average FBG per patient: 120-140 mg/dl Last patient reported W0J: Not reported - has appt with endocrinologist in Jan 2014  Using straws: No Drinking while eating: No Hair loss: No Carbonated beverages: NO N/V/D/C: No Last Lap-Band fill: None  Recent physical activity:  No exercise in last 3 weeks d/t fall at child's school; Just now feeling better  Progress Towards Goal(s):  In progress.  Handouts given during visit include:  Phase 3B: High Protein + Non-Starchy Vegetables  Samples given during visit include:   Freedavite MVI: 1 bottle Lot # [id number]; Exp: 01/17  VerioIQ Meter (starter kit): 1 ea Lot # [id number];  Exp: 07/14  VerioIQ Strips: 3 boxes (10 strips/box) Lot # [id number]; Exp: 04/14   Nutritional Diagnosis:  [name]-3.3 Overweight/obesity related to past poor dietary habits and physical inactivity as evidenced by patient w/ recent Lapband surgery following dietary guidelines for continued weight loss.    Intervention:  Nutrition education/diet advancement.  Monitoring/Evaluation:  Dietary intake, exercise, lap band fills, and body weight. Follow up in 1 month for 3 month post-op visit.

## 2012-10-23 NOTE — Patient Instructions (Addendum)
Goals:  Follow Phase 3B: High Protein + Non-Starchy Vegetables  Eat 3-6 small meals/snacks, every 3-5 hrs  Increase lean protein foods to meet 60g goal  Increase fluid intake to 64oz +  Avoid drinking 15 minutes before, during and 30 minutes after eating  Aim for >30 min of physical activity daily  Try 50% or 75% reduced fat Cabot cheese

## 2012-10-31 ENCOUNTER — Ambulatory Visit (INDEPENDENT_AMBULATORY_CARE_PROVIDER_SITE_OTHER): Payer: Federal, State, Local not specified - PPO | Admitting: Surgery

## 2012-10-31 ENCOUNTER — Encounter (INDEPENDENT_AMBULATORY_CARE_PROVIDER_SITE_OTHER): Payer: Self-pay | Admitting: Surgery

## 2012-10-31 DIAGNOSIS — Z9884 Bariatric surgery status: Secondary | ICD-10-CM

## 2012-10-31 NOTE — Progress Notes (Signed)
Re:   Megan Gomez DOB:   1961/02/14 MRN:   [id number]  ASSESSMENT AND PLAN: 1.  S/P Lap Band, APS - 08/28/2012 - D. Cheyanna Strick  Morbid obesity, weight - 219, BMI -38.8.  I filled her to about 3.0 cc  She will see me back in 2 months.  2.  Hypertension.  Since 2008. 3.  Diabetes mellitus.  Off oral hypoglycemics, for now.  Since 2001.  Sees Dr. Ester Rink.  He has outlined a post op plan for her with her diabetes. 4.  Back disease.  History of 3 ruptured disks. 5.  Left kidney stone by [place]. 6.  Hepatic steatosis by [place]. 7.  Tertiary esophageal spasms and GERD - essentially asymptomatic on UGI. 8.  On cholesterol med.  Chief Complaint  Patient presents with  . Lap Band Fill   REFERRING PHYSICIAN:  Dr. Ester Rink.  HISTORY OF PRESENT ILLNESS: Megan Gomez is a 51 y.o. (DOB: [birth year])  white female whose primary care physician is FULP, CAMMIE, MD .  She comes for her post op visit for a lap band - 08/28/2012.  She has a sister who had a RYGB in [place].  She has done well.  This is her first post op visit.   She has done well.  She is walking on a tread mill about 30 minutes/day.  She fell about 2 weeks ago with her daughter and this has slowed down her exercise.  She does feel some resistance with the lap band.  She does think that she has much more energy than normal.  Also, she has landed a part time job with the [employer].   Past Medical History  Diagnosis Date  . Diabetes mellitus   . Hypertension   . Hyperlipidemia     Current Outpatient Prescriptions  Medication Sig Dispense Refill  . acetaminophen (TYLENOL) 500 MG tablet Take 500 mg by mouth every 6 (six) hours as needed.      . B COMPLEX VITAMINS SL Place 1 tablet under the tongue daily.      . Biotin (RA BIOTIN) 1000 MCG tablet Take 2,000 mcg by mouth daily.      . Calcium Citrate-Vitamin D (CALCIUM CITRATE +D PO) Take 1,500 mg by mouth daily.      Marland Kitchen losartan-hydrochlorothiazide (HYZAAR) 100-25 MG per tablet Take  1 tablet by mouth daily before breakfast.       . metoprolol tartrate (LOPRESSOR) 25 MG tablet Take 25 mg by mouth 2 (two) times daily.      . Multiple Vitamin (MULTIVITAMIN) capsule Take 1 capsule by mouth daily.      . simvastatin (ZOCOR) 40 MG tablet Take 40 mg by mouth at bedtime.       . [DISCONTINUED] fexofenadine (ALLEGRA) 180 MG tablet Take 180 mg by mouth as needed.       . [DISCONTINUED] mometasone (NASONEX) 50 MCG/ACT nasal spray Place 2 sprays into the nose daily.          Allergies  Allergen Reactions  . Cephalexin Swelling  . Chlorhexidine Gluconate Itching  . Demerol Nausea And Vomiting  . Stadol (Butorphanol Tartrate) Nausea And Vomiting    REVIEW OF SYSTEMS:  Cardiac:  Hypertension x 5 years. No history of heart disease.  No history of prior cardiac catheterization.    Endocrine:  Diabetes x 10 years.  Sees Dr. Ester Rink. No thyroid disease. Gastrointestinal:  She is set up for a colonoscopy by Dr. Loreta Ave in November 2013. GYN:  Irregular periods.  She has two children, 8 and 12, both by C section.  Hematologic:  Legs cramp.  She has 3 disks ruptured in her back. She dislocated her hip in 2011. Psycho-social:  The patient is oriented.   The patient has no obvious psychologic or social impairment to understanding our conversation and plan.  She sees Dr. Cyndia Skeeters for psychiatry.  SOCIAL and FAMILY HISTORY: Married.  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 2 children: 8 and 12 years.  PHYSICAL EXAM: BP 122/80  Pulse 74  Temp 97.8 F (36.6 C) (Temporal)  Resp 18  Ht 5\' 3"  (1.6 m)  Wt 188 lb 8 oz (85.503 kg)  BMI 33.39 kg/m2  General: WN obese WF who is alert and generally healthy appearing.  Abdomen: Soft. No mass. Incisions are healing.  Wound looks good.  Procedure:  I accessed her lap band.  She had about 1.0 cc in the lap band and I added 2.0 cc for a total of 3.0 cc.  She tolerated water after the  fill.  DATA REVIEWED: None new.  Ovidio Kin, MD,  [facility] [address] [place]  [address]   [place], [place]    [address] Phone:  [phone] FAX:  [phone]

## 2013-02-13 ENCOUNTER — Encounter (INDEPENDENT_AMBULATORY_CARE_PROVIDER_SITE_OTHER): Payer: Self-pay | Admitting: Surgery

## 2013-02-13 ENCOUNTER — Ambulatory Visit (INDEPENDENT_AMBULATORY_CARE_PROVIDER_SITE_OTHER): Payer: Federal, State, Local not specified - PPO | Admitting: Surgery

## 2013-02-13 NOTE — Progress Notes (Addendum)
Re:   Megan Gomez DOB:   04-11-1961 MRN:   [id number]  ASSESSMENT AND PLAN: 1.  S/P Lap Band, APS - 08/28/2012 - D. Dorrian Doggett  Morbid obesity, weight - 219, BMI -38.8.  I filled her to about 3.2 cc  She will see me back in 8-10 weeks.  [Called at [facility] after eating chicken she got chest pain.  Vomited and felt better.  She probably ate too fast or too large a bite.  She's to go back on liquids for 2 days, then slowly add solids back.  DN  02/18/2013]  2.  Hypertension.  Since 2008. 3.  Diabetes mellitus.  Off oral hypoglycemics, for now.  Since 2001.  Sees Dr. Ester Rink.  He has outlined a post op plan for her with her diabetes. 4.  Back disease.  History of 3 ruptured disks. 5.  Left kidney stone by [place]. 6.  Hepatic steatosis by [place]. 7.  Tertiary esophageal spasms and GERD - essentially asymptomatic on UGI. 8.  On cholesterol med.  Chief Complaint  Patient presents with  . Lap Band Fill   REFERRING PHYSICIAN:  Dr. Ester Rink.  HISTORY OF PRESENT ILLNESS: Megan Gomez is a 52 y.o. (DOB: [birth year])  white female whose primary care physician is Megan, CAMMIE, MD .  She comes for follow up of a lap band.    She has done well.  She is walking on a tread mill about 30 minutes/day.  Though she is only doing this about 3 times per week.  She feels that she is eating more than she should.  She does notice that chicken and tuna will hang up.  She is having no issues with breakfast - she takes eggs/cheese.  She seems to be in the border between yellow/green zone.  We agreed that it seems to be worth adding fluid.   Past Medical History  Diagnosis Date  . Diabetes mellitus   . Hypertension   . Hyperlipidemia     Current Outpatient Prescriptions  Medication Sig Dispense Refill  . acetaminophen (TYLENOL) 500 MG tablet Take 500 mg by mouth every 6 (six) hours as needed.      Marland Kitchen atorvastatin (LIPITOR) 40 MG tablet       . B COMPLEX VITAMINS SL Place 1 tablet under the tongue daily.      .  Biotin (RA BIOTIN) 1000 MCG tablet Take 2,000 mcg by mouth daily.      . Calcium Citrate-Vitamin D (CALCIUM CITRATE +D PO) Take 1,500 mg by mouth daily.      Marland Kitchen losartan-hydrochlorothiazide (HYZAAR) 100-25 MG per tablet Take 1 tablet by mouth daily before breakfast.       . metoprolol tartrate (LOPRESSOR) 25 MG tablet Take 25 mg by mouth 2 (two) times daily.      . Multiple Vitamin (MULTIVITAMIN) capsule Take 1 capsule by mouth daily.      . simvastatin (ZOCOR) 40 MG tablet Take 40 mg by mouth at bedtime.       . [DISCONTINUED] fexofenadine (ALLEGRA) 180 MG tablet Take 180 mg by mouth as needed.       . [DISCONTINUED] mometasone (NASONEX) 50 MCG/ACT nasal spray Place 2 sprays into the nose daily.         No current facility-administered medications for this visit.    Allergies  Allergen Reactions  . Cephalexin Swelling  . Chlorhexidine Gluconate Itching  . Demerol Nausea And Vomiting  . Stadol (Butorphanol Tartrate) Nausea And Vomiting  REVIEW OF SYSTEMS: Cardiac:  Hypertension x 5 years. No history of heart disease.  No history of prior cardiac catheterization.   Endocrine:  Diabetes x 10 years.  Sees Dr. Ester Rink. No thyroid disease. Gastrointestinal:  She is set up for a colonoscopy by Dr. Loreta Ave in November 2013. GYN:  Irregular periods.  She has two children, 8 and 12, both by C section.  Hematologic:  Legs cramp.  She has 3 disks ruptured in her back. She dislocated her hip in 2011. Psycho-social:   She sees Dr. Cyndia Skeeters for psychiatry.  SOCIAL and FAMILY HISTORY: Married.  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 2 children: 8 and 12 years. Has sister in [place] who had RYGB.  PHYSICAL EXAM: BP 124/78  Pulse 68  Temp(Src) 97.8 F (36.6 C) (Temporal)  Resp 16  Ht 5\' 3"  (1.6 m)  Wt 181 lb 6.4 oz (82.283 kg)  BMI 32.14 kg/m2  General: WN obese WF who is alert and generally healthy appearing.  Abdomen: Soft. No mass.  Incisions are healing.  Wound looks good.  Her port is tilted cranially.  Procedure:  I accessed her lap band.  She had about 1.4 cc in the lap band and I added 1.8 cc for a total of 3.2 cc.  She tolerated water after the fill.  DATA REVIEWED: None new.  Ovidio Kin, MD,  [facility] [address] [place]  [address]   [place], [place]    [address] Phone:  [phone] FAX:  [phone]

## 2013-02-25 ENCOUNTER — Ambulatory Visit: Payer: Federal, State, Local not specified - PPO | Admitting: *Deleted

## 2013-03-04 ENCOUNTER — Ambulatory Visit: Payer: Federal, State, Local not specified - PPO | Admitting: *Deleted

## 2013-03-17 IMAGING — RF DG UGI W/ KUB
15 of 21 series · 15 of 21 positions shown · non-contrast
Comparison: None.

CLINICAL DATA: Bariatric screening evaluation.  Heartburn

UPPER GI SERIES WITH KUB
TECHNIQUE: Routine upper GI series was performed with thin barium
Fluoroscopy Time: 1.5 minutes

[Series 1: run · 1 of 1 slices shown (1 of 10)]
[im 1/1]
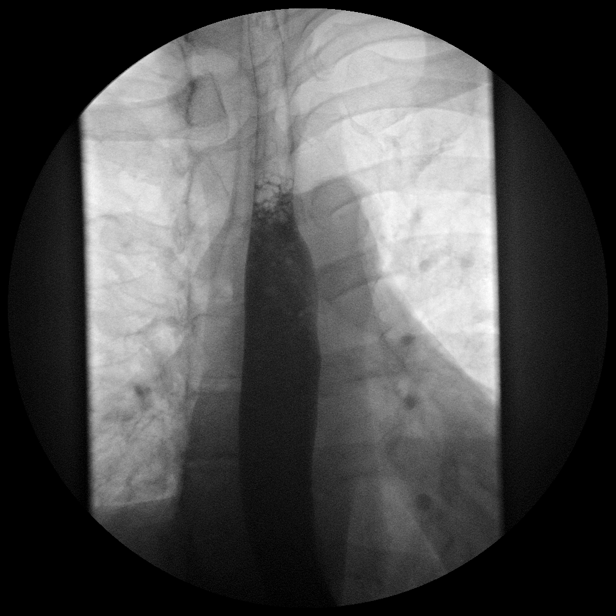

[Series 3: run · 1 of 1 slices shown (2 of 10)]
[im 1/1]
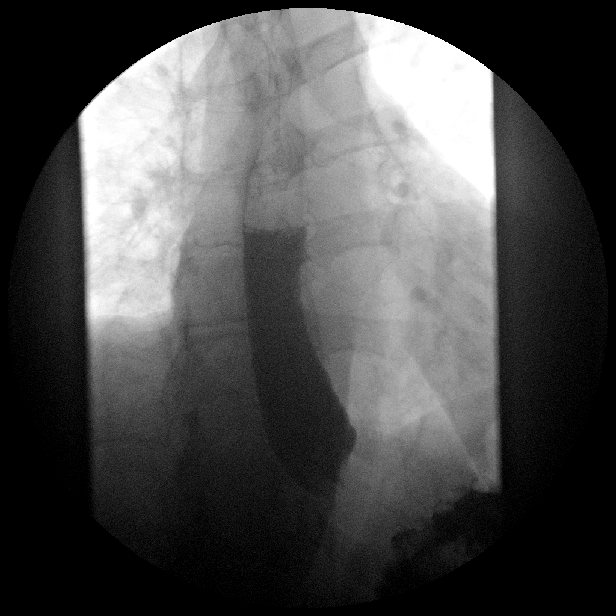

[Series 4: run · 1 of 1 slices shown (3 of 10)]
[im 1/1]
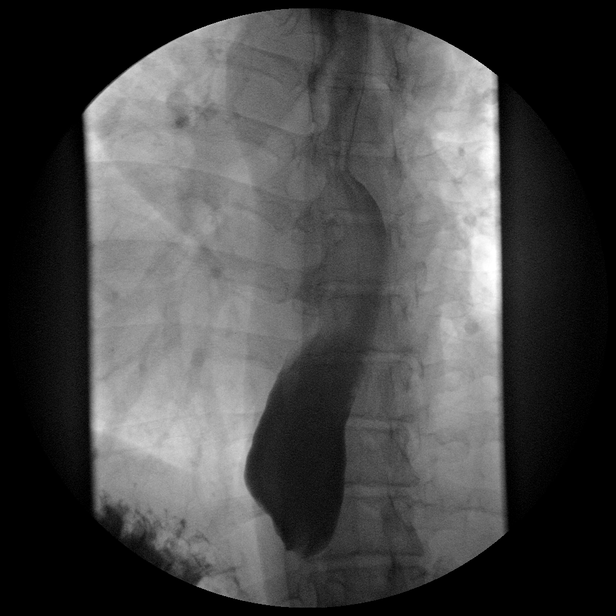

[Series 5: run · 1 of 1 slices shown (4 of 10)]
[im 1/1]
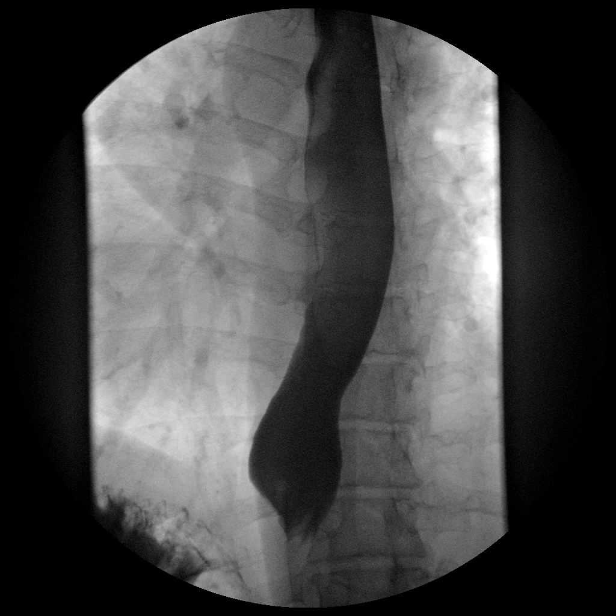

[Series 7: run · 1 of 1 slices shown (5 of 10)]
[im 1/1]
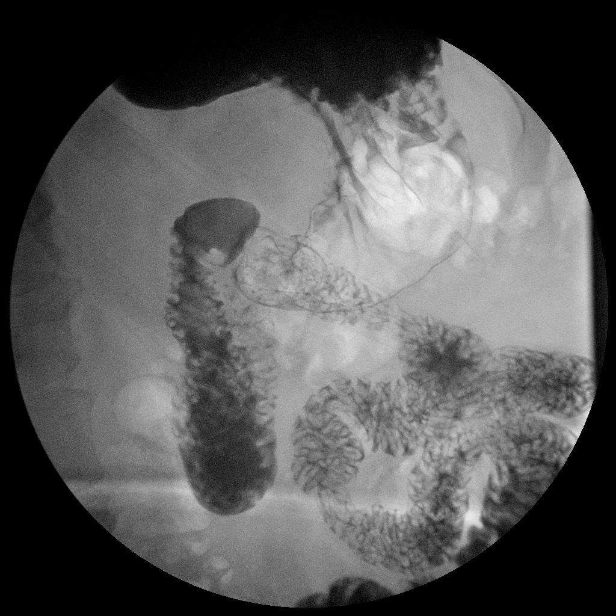

[Series 8: run · 1 of 1 slices shown (6 of 10)]
[im 1/1]
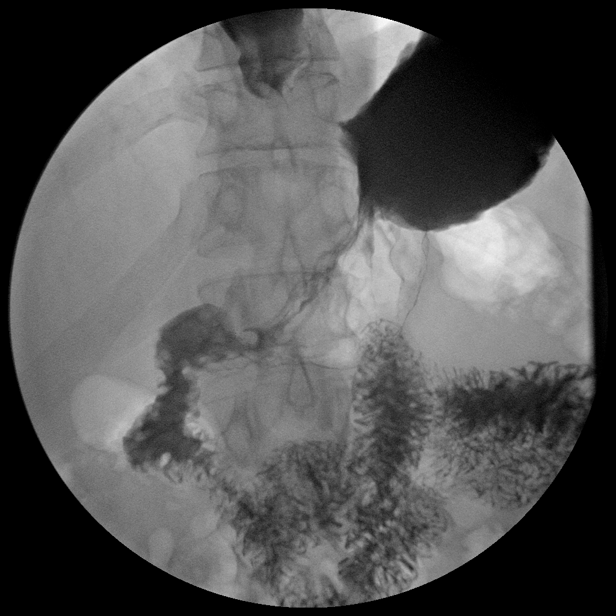

[Series 10: run · 1 of 1 slices shown (7 of 10)]
[im 1/1]
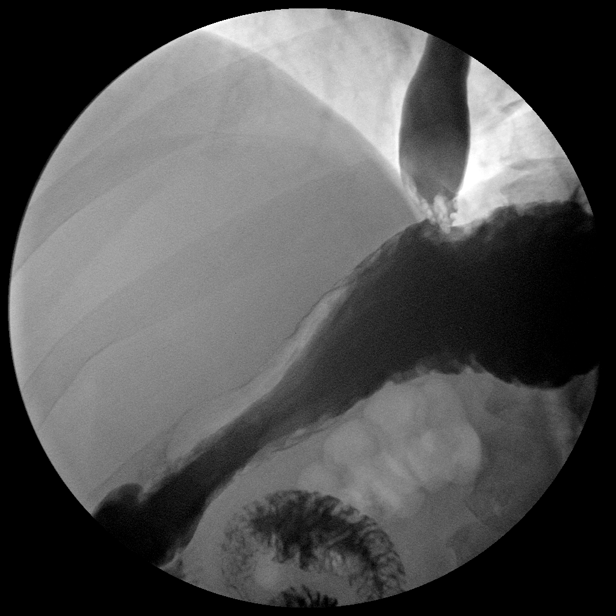

[Series 11: run · 1 of 1 slices shown (8 of 10)]
[im 1/1]
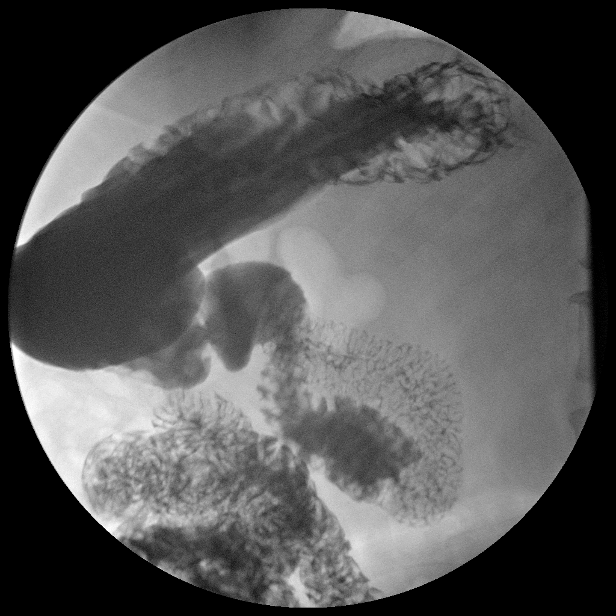

[Series 12: run · 1 of 1 slices shown (9 of 10)]
[im 1/1]
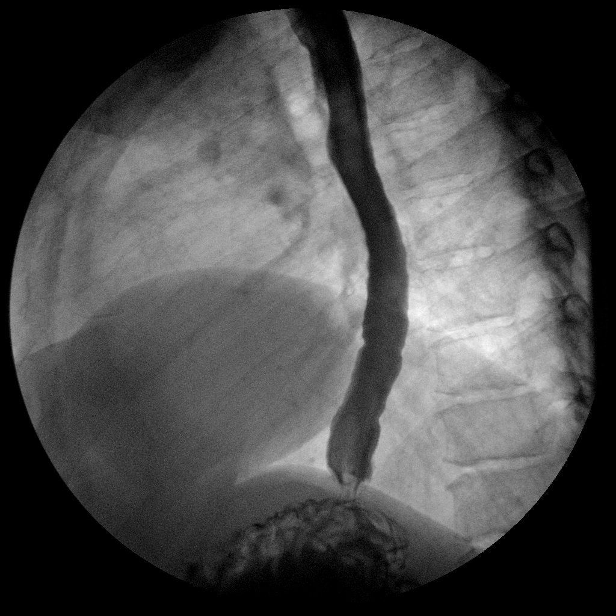

[Series 14: run · 1 of 1 slices shown (10 of 10)]
[im 1/1]
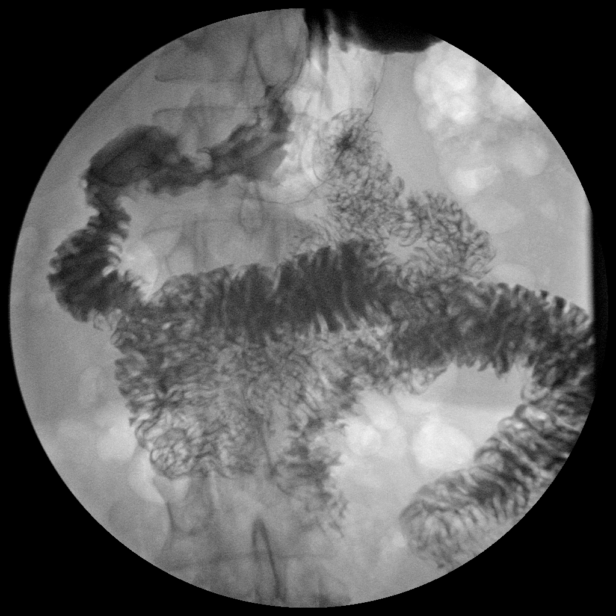

[Series 1003: view not recorded · 0.20mm/px · 1 of 1 slices shown (1 of 5)]
[im 1/1]
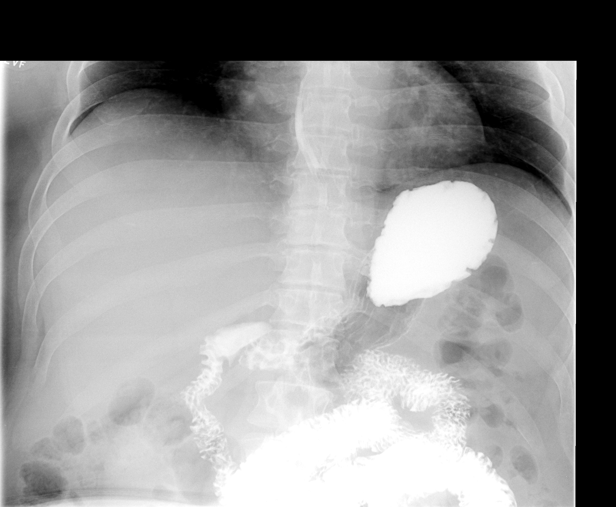

[Series 1005: view not recorded · 0.20mm/px · 1 of 1 slices shown (2 of 5)]
[im 1/1]
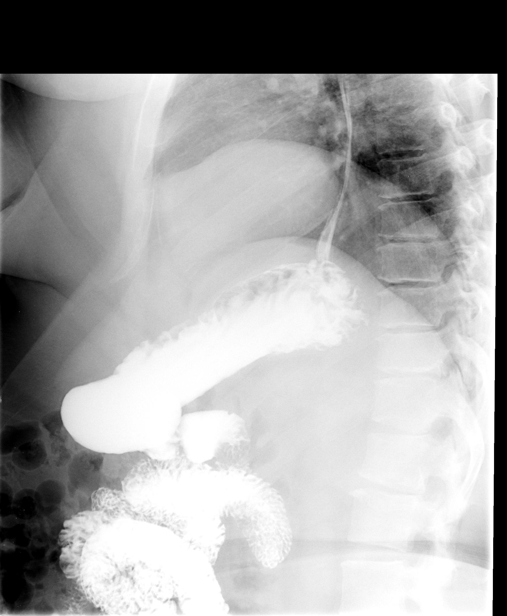

[Series 1006: view not recorded · 0.20mm/px · 1 of 1 slices shown (3 of 5)]
[im 1/1]
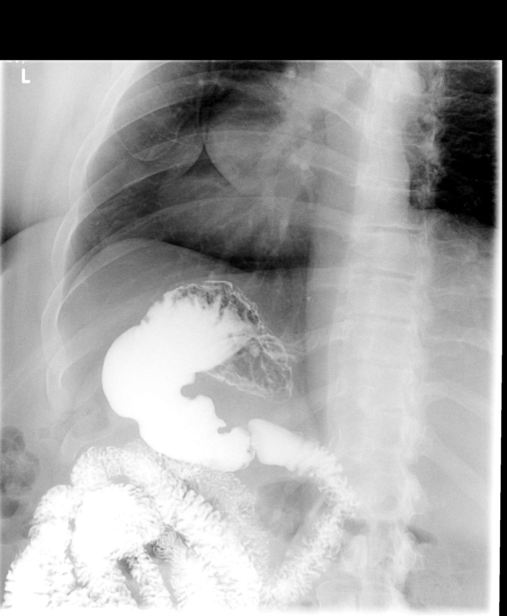

[Series 1006: view not recorded · 0.20mm/px · 1 of 1 slices shown (4 of 5)]
[im 1/1]
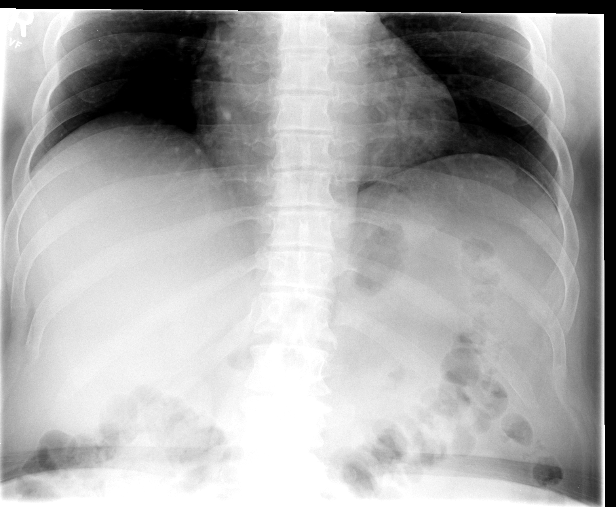

[Series 1007: view not recorded · 0.20mm/px · 1 of 1 slices shown (5 of 5)]
[im 1/1]
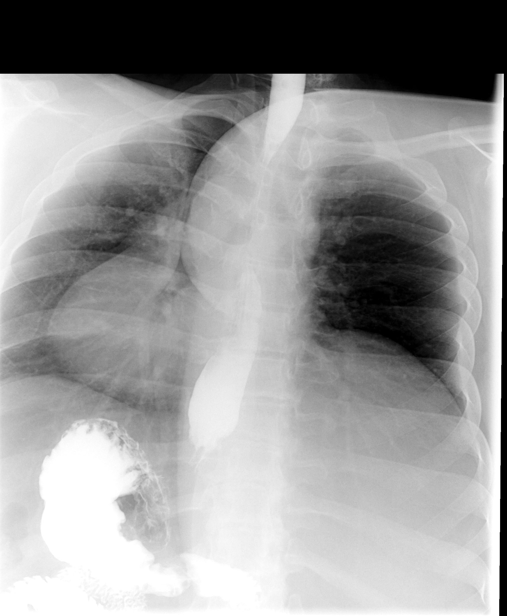

[15 of 21 positions shown; findings below may reference images not displayed]

FINDINGS: KUB:  A nonobstructive bowel gas pattern is seen.  No
abnormal calcifications are seen.  Mild degenerative change of the
upper lumbar spine is noted.  Bony structures are otherwise intact.

Upper GI:  Esophageal mucosa appeared within normal limits.  No
hiatal hernia was evident.  Gastroesophageal reflux was noted with
changes in position to the level of the high cervical esophagus.
This demonstrated very poor clearing with a standing column of
contrast noted throughout the portions of the exam performed in the
supine and prone positions. A few episodes of esophageal tertiary
contractions were noted.

Gastric mucosa and motility appear within normal limits.  The
duodenal bulb was well distended has a normal morphology.
Visualized small bowel to the level of the proximal jejunum appears
unremarkable.
IMPRESSION: Marked gastroesophageal reflux with poor clearing as described
above.  Episodes of tertiary esophageal contractions.  Normal
appearance to the stomach and visualized small bowel

## 2013-03-30 ENCOUNTER — Encounter: Payer: Federal, State, Local not specified - PPO | Attending: Surgery | Admitting: *Deleted

## 2013-03-30 ENCOUNTER — Encounter: Payer: Self-pay | Admitting: *Deleted

## 2013-03-30 DIAGNOSIS — Z09 Encounter for follow-up examination after completed treatment for conditions other than malignant neoplasm: Secondary | ICD-10-CM | POA: Insufficient documentation

## 2013-03-30 DIAGNOSIS — Z9884 Bariatric surgery status: Secondary | ICD-10-CM | POA: Insufficient documentation

## 2013-03-30 DIAGNOSIS — E669 Obesity, unspecified: Secondary | ICD-10-CM | POA: Insufficient documentation

## 2013-03-30 DIAGNOSIS — Z713 Dietary counseling and surveillance: Secondary | ICD-10-CM | POA: Insufficient documentation

## 2013-03-30 NOTE — Patient Instructions (Addendum)
Goals:  Follow Phase 3B: High Protein + Non-Starchy Vegetables  Eat 3-6 small meals/snacks - No meal skipping  Increase fluid intake to 64oz +  Add 15 grams of carbohydrate (fruit, whole grains) with meals  Always have protein with all carbs - Try PB2 instead of nuts  Aim for >30 min of physical activity daily  Check insurance for coverage of Continuous Glucose Monitoring (CGM)  If covered, have Dr. Sharl Ma send a referral to [facility]   You'll meet with Jenita Seashore, RD, LDN, CDE

## 2013-03-30 NOTE — Progress Notes (Signed)
  Follow-up visit:  6 Mos Post-Operative LAGB Surgery  Medical Nutrition Therapy:  Appt start time:  230  End time:  315.  Primary concerns today: Post-operative Bariatric Surgery Nutrition Management. Megan Gomez returns today for 6 mo f/u concerned about her lack of weight loss. Recall reveals excessive fat intake via almonds (~40-50g) daily. This is likely the cause of her slowed wt loss.  Is exercising again and overall food choices WNL, though fluid intake low. Reports elevated FBGs, which may be d/t low CHO/high fat intake.  Has lost a total of 23 lbs of FAT MASS in the last year.  Surgery date: 08/28/12 Surgery type: LAGB Start weight at [facility]: 222.0 lbs (03/28/12)  Weight today: 183.5 lbs Weight change: 8.0 lbs Total weight lost: 38.5 lbs BMI: 32.5 kg/m^2  Goal weight: 145 lbs % goal met: 50%  TANITA  BODY COMP RESULTS  03/28/12 09/11/12 10/23/12 03/30/13   Fat Mass (lbs) 101.0 92.0 85.0 78.0   Fat Free Mass (lbs) 121.0 110.5 106.5 105.5   Total Body Water (lbs) 88.5 81.0 78.0 77.0   24-hr recall: B (AM): Atkins shake (15g) Snk (AM): 1-2 oz almonds (6-12g) L (PM): [place] yogurt w/ roasted eggplant w/ garlic, 3 oz tuna or chicken (30-35g) Snk (PM): 1-2 oz almonds (6-12g) OR 2 oz cheese D (PM): 3 oz hamburger patty w/ tomatoes, onions, 1 tbsp olive oil mayo OR 2 oz nuts  (12-25g) Snk (PM): NONE or cheese/nuts (0-6g)  Fluid intake:  Decaf coffee (2-3 cups), water 24 oz = 48 oz  Estimated total protein intake: 75-100g  Medications:  Resumed metformin;  Supplementation: Taking as directed  CBG monitoring: Daily (FBG or 2hrPP) Average FBG per patient: 104-208 mg/dl Average 2hrPP: 161 mg (checks 2 hrs after end of meal - discussed checking 2 hrs after first bite) Last patient reported A1c: 7.1% (11/2012 @ Dr. Sharl Ma)  Using straws: No Drinking while eating: No Hair loss:  Yes until used Head & Shoulders (per dermatologist d/t dandruff) Carbonated beverages: No N/V/D/C: Regurgitation  with chicken after last fill; likely d/t eating . Scared to eat fish, chicken now. Urged her to continue trying.  Last Lap-Band fill:  02/13/13 - 1.8 cc; Has a total of 3.2 cc. Does not feel she needs a fill at this time.  Recent physical activity:  Walking 20-30 min daily   Progress Towards Goal(s):  In progress.   Nutritional Diagnosis:  [place]-3.3 Overweight/obesity related to past poor dietary habits and physical inactivity as evidenced by patient w/ recent Lapband surgery following dietary guidelines for continued weight loss.    Intervention:  Nutrition education/reinforcement  Monitoring/Evaluation:  Dietary intake, exercise, lap band fills, and body weight. Follow up in 3 month for 9 month post-op visit.

## 2013-05-16 ENCOUNTER — Encounter (INDEPENDENT_AMBULATORY_CARE_PROVIDER_SITE_OTHER): Payer: Federal, State, Local not specified - PPO | Admitting: Surgery

## 2013-07-22 ENCOUNTER — Ambulatory Visit: Payer: Federal, State, Local not specified - PPO | Admitting: *Deleted

## 2013-08-01 ENCOUNTER — Encounter (INDEPENDENT_AMBULATORY_CARE_PROVIDER_SITE_OTHER): Payer: Federal, State, Local not specified - PPO | Admitting: Surgery

## 2013-08-07 ENCOUNTER — Ambulatory Visit: Payer: Federal, State, Local not specified - PPO | Admitting: *Deleted

## 2013-08-07 ENCOUNTER — Encounter (INDEPENDENT_AMBULATORY_CARE_PROVIDER_SITE_OTHER): Payer: Federal, State, Local not specified - PPO | Admitting: Surgery

## 2013-08-19 ENCOUNTER — Encounter: Payer: Federal, State, Local not specified - PPO | Attending: Surgery | Admitting: *Deleted

## 2013-08-19 ENCOUNTER — Encounter: Payer: Self-pay | Admitting: *Deleted

## 2013-08-19 DIAGNOSIS — Z713 Dietary counseling and surveillance: Secondary | ICD-10-CM | POA: Insufficient documentation

## 2013-08-19 NOTE — Progress Notes (Addendum)
  Follow-up visit:  12 Months Post-Operative LAGB Surgery  Medical Nutrition Therapy:  Appt start time:  1000  End time:  1045.  Primary concerns today: Post-operative Bariatric Surgery Nutrition Management. Megan Gomez returns today for 12 mo f/u with 2 lb wt gain. Reports she was living in [place] all summer and made poor food choices including bbq chicken, potato salad, and rice. Did not eat large portions however. Also reports some emotional eating when mad or stressed. States interest in BELT program, though has received no return phone calls from multiple messages she left over the summer. Is ready to get back on track.       Surgery date: 08/28/12 Surgery type: LAGB Start weight at [facility]: 222.0 lbs (03/28/12)  Weight today: 185.5 lbs Weight change: + 2.0 lbs Total weight lost: 36.5 lbs BMI: 32.9 kg/m^2  Goal weight: 145 lbs % goal met: 50%  TANITA  BODY COMP RESULTS  03/28/12 09/11/12 10/23/12 03/30/13 08/19/13   Fat Mass (lbs) 101.0 92.0 85.0 78.0 78.0   Fat Free Mass (lbs) 121.0 110.5 106.5 105.5 107.5   Total Body Water (lbs) 88.5 81.0 78.0  77.0 78.5   Fluid intake:  Decaf coffee (2-3 cups), water 24 oz = 48 oz  Estimated total protein intake: 75-100g  Medications:  Resumed metformin  Supplementation: Taking as directed  CBG monitoring: Most days (FBG or 2hrPP) Average FBG per patient: 107 mg/dl Average 2hrPP: 295-284 mg (2 hrs after first bite) Last patient reported A1c: 6.8 or 6.9% (March or April  2014 @ Dr. Sharl Ma)  Using straws: No Drinking while eating: No Hair loss:  No Carbonated beverages: No N/V/D/C: None reported.  Last Lap-Band fill:  02/13/13 - 1.8 cc; Has a total of 3.2 cc.   Recent physical activity:  Walking 20-30 min daily (walks dog, treadmill). Plans to start using pedometer again.    Progress Towards Goal(s):  In progress.   Nutritional Diagnosis:  [place]-3.3 Overweight/obesity related to past poor dietary habits and physical inactivity as evidenced by  patient w/ recent Lapband surgery following dietary guidelines for continued weight loss.    Intervention:  Nutrition education/reinforcement  Monitoring/Evaluation:  Dietary intake, exercise, lap band fills, and body weight. Follow up in 6 weeks.

## 2013-08-19 NOTE — Patient Instructions (Addendum)
Goals:  Follow Phase 3B: High Protein + Non-Starchy Vegetables  Eat 3-6 small meals/snacks - No meal skipping  Increase fluid intake to 64oz +  Add 15 grams of carbohydrate (fruit, whole grains) with meals  Always have protein with all carbs - Try PB2 instead of nuts  Aim for >30 min of physical activity daily  Watch food choices and do not over eat.

## 2013-08-21 ENCOUNTER — Ambulatory Visit (INDEPENDENT_AMBULATORY_CARE_PROVIDER_SITE_OTHER): Payer: Federal, State, Local not specified - PPO | Admitting: Surgery

## 2013-08-21 ENCOUNTER — Encounter (INDEPENDENT_AMBULATORY_CARE_PROVIDER_SITE_OTHER): Payer: Self-pay | Admitting: Surgery

## 2013-08-21 DIAGNOSIS — Z9884 Bariatric surgery status: Secondary | ICD-10-CM

## 2013-08-21 NOTE — Progress Notes (Signed)
Re:   Megan Gomez DOB:   1961/09/06 MRN:   [id number]  ASSESSMENT AND PLAN: 1.  S/P Lap Band, APS - 08/28/2012 - D. Anyae Griffith  Morbid obesity, weight - 219, BMI -38.8.  No fill today.  She has about 3.2 cc in her lap band.  She is doing well.  The injury to her right foot has decreased her exercise and made her weight stall.  But it sounds like she is in a good zone with her lap band.  She will see me back in 4 months.  2.  Hypertension.  Since 2008. 3.  Diabetes mellitus.  On Metformin 500 mg BID  Since 2001.  Sees Dr. Ester Rink about every 3 months. 4.  Back disease.  History of 3 ruptured disks. 5.  Left kidney stone by [place]. 6.  Hepatic steatosis by [place]. 7.  Tertiary esophageal spasms and GERD - essentially asymptomatic on UGI. 8.  On cholesterol med. 9.  Fracture bone(s) in right foot.  In boot.  Followed by Dr. Victorino Dike.  Chief Complaint  Patient presents with  . Lap Band Fill    lap fill   REFERRING PHYSICIAN:  Dr. Ester Rink.  HISTORY OF PRESENT ILLNESS: Megan Gomez is a 52 y.o. (DOB: [birth year])  white female whose primary care physician is FULP, CAMMIE, MD Deboraha Sprang at [facility]).  She comes for follow up of a lap band.    She went to spent the summer in [place] with her family. She got back about one month ago.  While there she dropped and hard bar on both her feet. She sought medical attention yesterday with Dr. Victorino Dike who diagnosed a fracture in her right foot has put her in a walking boot.  This injury has limited her physical activity and her weight is flat since her last visit.  She's had about 2 times eating too large a bite and getting nauseated. She is to wait a couple hours to eat solids for breakfast. She feels comfortable with her current lap band adjustment.  I think that she is in the green zone.  We agreed for her to get over her foot injury (ina boot for 6 to 10 weeks).  Then when she can exercise more, to come back to see me.   Past Medical History   Diagnosis Date  . Diabetes mellitus   . Hypertension   . Hyperlipidemia     Current Outpatient Prescriptions  Medication Sig Dispense Refill  . acetaminophen (TYLENOL) 500 MG tablet Take 500 mg by mouth every 6 (six) hours as needed.      Marland Kitchen atorvastatin (LIPITOR) 40 MG tablet Take 40 mg by mouth daily.       . B COMPLEX VITAMINS SL Place 1 tablet under the tongue daily.      . Biotin (RA BIOTIN) 1000 MCG tablet Take 2,000 mcg by mouth daily.      . Calcium Citrate-Vitamin D (CALCIUM CITRATE +D PO) Take 1,500 mg by mouth daily.      Marland Kitchen losartan-hydrochlorothiazide (HYZAAR) 100-25 MG per tablet Take 1 tablet by mouth daily before breakfast.       . metFORMIN (GLUCOPHAGE) 500 MG tablet Take 500 mg by mouth daily with breakfast.      . metoprolol tartrate (LOPRESSOR) 25 MG tablet Take 25 mg by mouth 2 (two) times daily.      . Multiple Vitamin (MULTIVITAMIN) capsule Take 1 capsule by mouth daily.      . [DISCONTINUED] fexofenadine (  ALLEGRA) 180 MG tablet Take 180 mg by mouth as needed.       . [DISCONTINUED] mometasone (NASONEX) 50 MCG/ACT nasal spray Place 2 sprays into the nose daily.         No current facility-administered medications for this visit.    Allergies  Allergen Reactions  . Cephalexin Swelling  . Chlorhexidine Gluconate Itching  . Demerol Nausea And Vomiting  . Stadol [Butorphanol Tartrate] Nausea And Vomiting    REVIEW OF SYSTEMS: Cardiac:  Hypertension x 5 years. No history of heart disease.  No history of prior cardiac catheterization.   Endocrine:  Diabetes x 10 years.  Sees Dr. Ester Rink. No thyroid disease. Gastrointestinal:  She is set up for a colonoscopy by Dr. Loreta Ave in November 2013. GYN:  Irregular periods.  She has two children, 8 and 12, both by C section.  Hematologic:  Legs cramp.  She has 3 disks ruptured in her back. She dislocated her hip in 2011. Psycho-social:   She sees Dr. Cyndia Skeeters for psychiatry.  SOCIAL and FAMILY HISTORY: Married.  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 2 children: 8 and 12 years. Has sister in [place] who had RYGB.  PHYSICAL EXAM: BP 120/78  Pulse 72  Temp(Src) 98.2 F (36.8 C) (Temporal)  Resp 14  Ht 5\' 3"  (1.6 m)  Wt 184 lb 6.4 oz (83.643 kg)  BMI 32.67 kg/m2  General: WN obese WF who is alert and generally healthy appearing.  Lungs:  Clear. Abdomen: Soft. No mass. Incisions are healing.  Wound looks good.  Her port is tilted cranially.  She complains of some soreness around the lap band port.  DATA REVIEWED: None new.  Ovidio Kin, MD,  [facility] [address] [place]  [address]   [place], [place]    [address] Phone:  [phone] FAX:  [phone]

## 2013-08-27 ENCOUNTER — Encounter (INDEPENDENT_AMBULATORY_CARE_PROVIDER_SITE_OTHER): Payer: Self-pay

## 2013-10-01 ENCOUNTER — Ambulatory Visit (INDEPENDENT_AMBULATORY_CARE_PROVIDER_SITE_OTHER): Payer: Federal, State, Local not specified - PPO

## 2013-10-01 ENCOUNTER — Ambulatory Visit (INDEPENDENT_AMBULATORY_CARE_PROVIDER_SITE_OTHER): Payer: Federal, State, Local not specified - PPO | Admitting: Podiatry

## 2013-10-01 ENCOUNTER — Encounter: Payer: Self-pay | Admitting: Podiatry

## 2013-10-01 VITALS — BP 121/77 | HR 70 | Resp 12

## 2013-10-01 DIAGNOSIS — R52 Pain, unspecified: Secondary | ICD-10-CM

## 2013-10-01 DIAGNOSIS — S92309A Fracture of unspecified metatarsal bone(s), unspecified foot, initial encounter for closed fracture: Secondary | ICD-10-CM

## 2013-10-01 NOTE — Progress Notes (Signed)
Megan Gomez presents today for followup of fracture to the second metatarsal head of the right foot. She's been wearing her Cam Dan Humphreys she states the toes are doing much better.  Objective: Much decrease in edema there is no erythema edema saline is drainage or odor to the right foot. She still has mild tenderness on palpation second metatarsal head right. Radiographs demonstrate a well healing fracture second metatarsal head right.  Assessment: Well-healing fractured second metatarsal right.  Plan: Continue use the Cam Walker for the next month followup with me as needed.

## 2013-10-02 ENCOUNTER — Ambulatory Visit: Payer: Federal, State, Local not specified - PPO | Admitting: Orthopedic Surgery

## 2013-11-06 ENCOUNTER — Encounter (INDEPENDENT_AMBULATORY_CARE_PROVIDER_SITE_OTHER): Payer: Federal, State, Local not specified - PPO | Admitting: Surgery

## 2013-12-19 ENCOUNTER — Encounter (INDEPENDENT_AMBULATORY_CARE_PROVIDER_SITE_OTHER): Payer: Self-pay

## 2013-12-19 ENCOUNTER — Encounter (INDEPENDENT_AMBULATORY_CARE_PROVIDER_SITE_OTHER): Payer: Self-pay | Admitting: Surgery

## 2013-12-19 ENCOUNTER — Ambulatory Visit (INDEPENDENT_AMBULATORY_CARE_PROVIDER_SITE_OTHER): Payer: Federal, State, Local not specified - PPO | Admitting: Surgery

## 2013-12-19 DIAGNOSIS — Z9884 Bariatric surgery status: Secondary | ICD-10-CM

## 2013-12-19 NOTE — Progress Notes (Signed)
Re:   Megan Gomez DOB:   21-Dec-1960 MRN:   [id number]  ASSESSMENT AND PLAN: 1.  S/P Lap Band, APS - 08/28/2012 - D. Sura Canul  Morbid obesity, weight - 219, BMI -38.8.  No fill today.  She has about 3.2 cc in her lap band.  She is just now increasing exercise.  We agreed to give her 6 to 8 weeks to increase her exercise level before adjusting her lap band.  2.  Hypertension.  Since 2008. 3.  Diabetes mellitus.  On Metformin 500 mg BID and Jardiance  Since 2001.  Sees Dr. Barbette Hair about every 3 months. 4.  Back disease.  History of 3 ruptured disks. 5.  Left kidney stone by [place]. 6.  Hepatic steatosis by [place]. 7.  Tertiary esophageal spasms and GERD - essentially asymptomatic on UGI. 8.  On cholesterol med. 9.  Fracture bone(s) in right foot. Sept 2014.  This is now better - though she has not increased her physical activity much 10.  Frozen left shoulder.  She is doing exercises that she found over the Internet.  She is left handed.  Chief Complaint  Patient presents with  . Lap Band Fill   REFERRING PHYSICIAN:  Dr. Barbette Hair.  HISTORY OF PRESENT ILLNESS: Megan Gomez is a 53 y.o. (DOB: [birth year])  white female whose primary care physician is FULP, CAMMIE, MD Sadie Haber at [facility]).  She comes for follow up of a lap band.    Weight if flat, but she has been eating the wrong things.  And she is still not much up to walking because of her right foot injury in Sept 2014.  This is better, though sore. Now she has a "frozen" left shoulder.  She cannot eat chicken, but does not have much resistance for other foods.  She would gauge that she is between the orange and green zone.  We agreed to giver her 6-8 weeks to see if she could increase her exercise, then think about a fill.   Past Medical History  Diagnosis Date  . Diabetes mellitus   . Hypertension   . Hyperlipidemia     Current Outpatient Prescriptions  Medication Sig Dispense Refill  . acetaminophen (TYLENOL) 500 MG  tablet Take 500 mg by mouth every 6 (six) hours as needed.      Marland Kitchen atorvastatin (LIPITOR) 40 MG tablet Take 40 mg by mouth daily.       . B COMPLEX VITAMINS SL Place 1 tablet under the tongue daily.      . Biotin (RA BIOTIN) 1000 MCG tablet Take 2,000 mcg by mouth daily.      . Calcium Citrate-Vitamin D (CALCIUM CITRATE +D PO) Take 1,500 mg by mouth daily.      Marland Kitchen losartan-hydrochlorothiazide (HYZAAR) 100-25 MG per tablet Take 1 tablet by mouth daily before breakfast.       . metFORMIN (GLUCOPHAGE) 500 MG tablet Take 500 mg by mouth daily with breakfast.      . metoprolol tartrate (LOPRESSOR) 25 MG tablet Take 25 mg by mouth 2 (two) times daily.      . Multiple Vitamin (MULTIVITAMIN) capsule Take 1 capsule by mouth daily.      . [DISCONTINUED] fexofenadine (ALLEGRA) 180 MG tablet Take 180 mg by mouth as needed.       . [DISCONTINUED] mometasone (NASONEX) 50 MCG/ACT nasal spray Place 2 sprays into the nose daily.         No current facility-administered medications for this  visit.    Allergies  Allergen Reactions  . Cephalexin Swelling  . Chlorhexidine Gluconate Itching  . Demerol Nausea And Vomiting  . Stadol [Butorphanol Tartrate] Nausea And Vomiting    REVIEW OF SYSTEMS: Cardiac:  Hypertension x 5 years. No history of heart disease.  No history of prior cardiac catheterization.   Endocrine:  Diabetes x 10 years.  Sees Dr. Barbette Hair. No thyroid disease. Gastrointestinal:  She is set up for a colonoscopy by Dr. Collene Mares in November 2013. GYN:  Irregular periods.  She has two children, 8 and 12, both by C section.  Hematologic:  Legs cramp.  She has 3 disks ruptured in her back. She dislocated her hip in 2011. Psycho-social:   She sees Dr. Ardath Sax for psychiatry.  SOCIAL and FAMILY HISTORY: Married.  She works in Investment banker, corporate for [employer]. Her husband works in Administrator with [employer]. 2 children: 8 and 12 years. Has sister in [place] who had RYGB.  PHYSICAL EXAM: Ht 5\' 3"  (1.6  m)  Wt 185 lb 6.4 oz (84.097 kg)  BMI 32.85 kg/m2  General: WN obese WF who is alert and generally healthy appearing.  Lungs:  Clear. Abdomen: Soft. No mass. Port looks good.  DATA REVIEWED: None new.  Alphonsa Overall, MD,  [facility], [address]  [place], [place]    [place] Phone:  [phone] FAX:  [phone]

## 2014-01-07 ENCOUNTER — Other Ambulatory Visit: Payer: Self-pay | Admitting: Orthopedic Surgery

## 2014-01-07 DIAGNOSIS — M25512 Pain in left shoulder: Secondary | ICD-10-CM

## 2014-01-14 ENCOUNTER — Other Ambulatory Visit: Payer: Federal, State, Local not specified - PPO

## 2014-01-23 ENCOUNTER — Other Ambulatory Visit: Payer: Federal, State, Local not specified - PPO

## 2014-01-25 ENCOUNTER — Ambulatory Visit
Admission: RE | Admit: 2014-01-25 | Discharge: 2014-01-25 | Disposition: A | Discharge status: Home / Self Care | Payer: Federal, State, Local not specified - PPO | Source: Ambulatory Visit | Attending: Orthopedic Surgery | Admitting: Orthopedic Surgery

## 2014-01-25 DIAGNOSIS — M25512 Pain in left shoulder: Secondary | ICD-10-CM

## 2014-01-29 ENCOUNTER — Encounter (INDEPENDENT_AMBULATORY_CARE_PROVIDER_SITE_OTHER): Payer: Federal, State, Local not specified - PPO | Admitting: Surgery

## 2014-02-06 ENCOUNTER — Ambulatory Visit (INDEPENDENT_AMBULATORY_CARE_PROVIDER_SITE_OTHER): Payer: Federal, State, Local not specified - PPO | Admitting: Surgery

## 2014-02-06 ENCOUNTER — Encounter (INDEPENDENT_AMBULATORY_CARE_PROVIDER_SITE_OTHER): Payer: Self-pay | Admitting: Surgery

## 2014-02-06 DIAGNOSIS — Z9884 Bariatric surgery status: Secondary | ICD-10-CM

## 2014-02-06 NOTE — Progress Notes (Signed)
Re:   Megan Gomez DOB:   Sep 13, 1961 MRN:   [id number]  ASSESSMENT AND PLAN: 1.  S/P Lap Band, APS - 08/28/2012 - D. Carles Florea  Morbid obesity, weight - 219, BMI -38.8.  She has about 3.2 cc in her lap band.  I added 0.8 cc for a total of 4.0 cc.  Either I or Jonni Sanger will see her back in 8 weeks.  2.  Hypertension.  Since 2008. 3.  Diabetes mellitus.  On Metformin 500 mg BID and Jardiance  Since 2001.  Sees Dr. Barbette Hair about every 3 months. 4.  Back disease.  History of 3 ruptured disks. 5.  Left kidney stone by [place]. 6.  Hepatic steatosis by [place]. 7.  Tertiary esophageal spasms and GERD - essentially asymptomatic on UGI. 8.  On cholesterol med. 9.  Left shoulder, rotator cuff tear.  See Dr. Keturah Barre. [place].  Plan PT first, then PT with steroid injection, then surgery, if needed.  No timetable.  Chief Complaint  Patient presents with  . Lap Band Fill   REFERRING PHYSICIAN:  Dr. Barbette Hair.  HISTORY OF PRESENT ILLNESS: Megan Gomez is a 53 y.o. (DOB: 17-May-1961)  white female whose primary care physician is FULP, CAMMIE, MD Sadie Haber at [facility]).  She comes for follow up of a lap band.    Weight is flat.  She has had trouble increasing her exercise because of her ortho problems.  She is in the yellow zone, not much in the green zone.  She has eggs and oatmeal for breakfast with no resistance.  She cannot eat chicken, but does not have much resistance for other foods.     Past Medical History  Diagnosis Date  . Diabetes mellitus   . Hypertension   . Hyperlipidemia   . Rotator cuff tear     left    Current Outpatient Prescriptions  Medication Sig Dispense Refill  . acetaminophen (TYLENOL) 500 MG tablet Take 500 mg by mouth every 6 (six) hours as needed.      Marland Kitchen atorvastatin (LIPITOR) 40 MG tablet Take 40 mg by mouth daily.       . B COMPLEX VITAMINS SL Place 1 tablet under the tongue daily.      . Biotin (RA BIOTIN) 1000 MCG tablet Take 2,000 mcg by mouth daily.      . Calcium  Citrate-Vitamin D (CALCIUM CITRATE +D PO) Take 1,500 mg by mouth daily.      Marland Kitchen FLUoxetine (PROZAC) 20 MG capsule       . JARDIANCE 10 MG TABS       . losartan (COZAAR) 100 MG tablet       . metFORMIN (GLUCOPHAGE) 500 MG tablet Take 500 mg by mouth daily with breakfast.      . metoprolol tartrate (LOPRESSOR) 25 MG tablet Take 25 mg by mouth 2 (two) times daily.      . Multiple Vitamin (MULTIVITAMIN) capsule Take 1 capsule by mouth daily.      . [DISCONTINUED] fexofenadine (ALLEGRA) 180 MG tablet Take 180 mg by mouth as needed.       . [DISCONTINUED] mometasone (NASONEX) 50 MCG/ACT nasal spray Place 2 sprays into the nose daily.         No current facility-administered medications for this visit.    Allergies  Allergen Reactions  . Cephalexin Swelling  . Chlorhexidine Gluconate Itching  . Demerol Nausea And Vomiting  . Stadol [Butorphanol Tartrate] Nausea And Vomiting    REVIEW OF  SYSTEMS: Cardiac:  Hypertension x 5 years. No history of heart disease.  No history of prior cardiac catheterization.   Endocrine:  Diabetes x 10 years.  Sees Dr. Barbette Hair. No thyroid disease. Gastrointestinal:  She is set up for a colonoscopy by Dr. Collene Mares in November 2013. GYN:  Irregular periods.  She has two children, 8 and 12, both by C section.  Hematologic:  Legs cramp.  She has 3 disks ruptured in her back. She dislocated her hip in 2011. Psycho-social:   She sees Dr. Ardath Sax for psychiatry.  SOCIAL and FAMILY HISTORY: Married.  She works in Investment banker, corporate for [employer]. Her husband works in Administrator with [employer]. 2 children: 8 and 12 years. Has sister in [place] who had RYGB.  PHYSICAL EXAM: BP 167/96  Pulse 61  Temp(Src) 98.4 F (36.9 C) (Oral)  Resp 14  Ht 5\' 3"  (1.6 m)  Wt 185 lb 6.4 oz (84.097 kg)  BMI 32.85 kg/m2  General: WN obese WF who is alert and generally healthy appearing.  Lungs:  Clear. Abdomen: Soft. No mass. Port looks good.  Procedure:  While in the office I  accessed her port.  I measured 3.2 cc (so it had the same amount of fluid).  I added 0.8 cc of fluid until there was pressure.  She tolerated water.  DATA REVIEWED: None new.  Alphonsa Overall, MD,  [facility], [address]  [place], [place]    [place] Phone:  [phone] FAX:  [phone]

## 2014-04-02 ENCOUNTER — Ambulatory Visit: Payer: Federal, State, Local not specified - PPO | Admitting: Endocrinology

## 2014-04-03 ENCOUNTER — Encounter (INDEPENDENT_AMBULATORY_CARE_PROVIDER_SITE_OTHER): Payer: Self-pay

## 2014-04-03 ENCOUNTER — Ambulatory Visit (INDEPENDENT_AMBULATORY_CARE_PROVIDER_SITE_OTHER): Payer: Federal, State, Local not specified - PPO | Admitting: Physician Assistant

## 2014-04-03 ENCOUNTER — Encounter (INDEPENDENT_AMBULATORY_CARE_PROVIDER_SITE_OTHER): Payer: Federal, State, Local not specified - PPO

## 2014-04-03 VITALS — BP 130/74 | HR 61 | Temp 98.2°F | Resp 16 | Ht 63.0 in | Wt 181.4 lb

## 2014-04-03 DIAGNOSIS — Z9884 Bariatric surgery status: Secondary | ICD-10-CM

## 2014-04-03 NOTE — Progress Notes (Signed)
  HISTORY: Megan Gomez is a 53 y.o.female who received an AP-Standard lap-band in October 2013 by Dr. Lucia Gaskins. She comes in with 4 lbs weight loss since her last visit with Dr. Lucia Gaskins in March. She has no new complaints. She reports having little to no hunger and her portion sizes overall remain small. She's very pleased with this. She's exercising at least 20 minutes, usually more, every day. Bread and chicken are the only foods that she cannot tolerate. She says salad is her go-to meal, as she enjoys them.  VITAL SIGNS: Filed Vitals:   04/03/14 1127  BP: 130/74  Pulse: 61  Temp: 98.2 F (36.8 C)  Resp: 16    PHYSICAL EXAM: Physical exam reveals a very well-appearing 53 y.o.female in no apparent distress Neurologic: Awake, alert, oriented Psych: Bright affect, conversant Respiratory: Breathing even and unlabored. No stridor or wheezing Extremities: Atraumatic, good range of motion. Skin: Warm, Dry, no rashes Musculoskeletal: Normal gait, Joints normal  ASSESMENT: 53 y.o.  female  s/p AP-Standard lap-band.   PLAN: She appears to be in the green zone today. We discussed a fill, but she would like to leave things as they are over the summertime, as she and her family tend to eat healthier and are more active. Her goal weight from Dr. Lucia Gaskins is 170, which is very much in reach over the next three months. I encouraged her to consider losing 3-4 lbs a month and by summer's end, she should be at that goal weight. I'll have her return in early August for re-evaluation.

## 2014-04-03 NOTE — Patient Instructions (Signed)
Return in three months. Focus on good food choices as well as physical activity. Return sooner if you have an increase in hunger, portion sizes or weight. Return also for difficulty swallowing, night cough, reflux.   

## 2014-04-15 ENCOUNTER — Ambulatory Visit: Payer: Federal, State, Local not specified - PPO | Admitting: Endocrinology

## 2014-07-03 ENCOUNTER — Encounter (INDEPENDENT_AMBULATORY_CARE_PROVIDER_SITE_OTHER): Payer: Federal, State, Local not specified - PPO

## 2014-08-07 ENCOUNTER — Encounter (INDEPENDENT_AMBULATORY_CARE_PROVIDER_SITE_OTHER): Payer: Federal, State, Local not specified - PPO

## 2015-01-27 ENCOUNTER — Other Ambulatory Visit: Payer: Self-pay

## 2015-01-27 DIAGNOSIS — Z1231 Encounter for screening mammogram for malignant neoplasm of breast: Secondary | ICD-10-CM

## 2015-02-03 ENCOUNTER — Ambulatory Visit
Admission: RE | Admit: 2015-02-03 | Discharge: 2015-02-03 | Disposition: A | Discharge status: Home / Self Care | Payer: Federal, State, Local not specified - PPO | Source: Ambulatory Visit

## 2015-02-03 DIAGNOSIS — Z1231 Encounter for screening mammogram for malignant neoplasm of breast: Secondary | ICD-10-CM

## 2015-02-13 ENCOUNTER — Other Ambulatory Visit: Payer: Self-pay | Admitting: Family Medicine

## 2015-02-13 ENCOUNTER — Other Ambulatory Visit (HOSPITAL_COMMUNITY)
Admission: RE | Admit: 2015-02-13 | Discharge: 2015-02-13 | Disposition: A | Discharge status: Home / Self Care | Payer: Federal, State, Local not specified - PPO | Source: Ambulatory Visit | Attending: Family Medicine | Admitting: Family Medicine

## 2015-02-13 DIAGNOSIS — Z01419 Encounter for gynecological examination (general) (routine) without abnormal findings: Secondary | ICD-10-CM | POA: Diagnosis not present

## 2015-02-13 LAB — HM PAP SMEAR

## 2015-02-17 LAB — CYTOLOGY - PAP

## 2015-11-29 LAB — HM COLONOSCOPY

## 2016-01-11 ENCOUNTER — Other Ambulatory Visit: Payer: Self-pay

## 2016-01-11 DIAGNOSIS — Z1231 Encounter for screening mammogram for malignant neoplasm of breast: Secondary | ICD-10-CM

## 2016-02-05 ENCOUNTER — Ambulatory Visit
Admission: RE | Admit: 2016-02-05 | Discharge: 2016-02-05 | Disposition: A | Discharge status: Home / Self Care | Payer: Federal, State, Local not specified - PPO | Source: Ambulatory Visit

## 2016-02-05 DIAGNOSIS — Z1231 Encounter for screening mammogram for malignant neoplasm of breast: Secondary | ICD-10-CM

## 2016-06-30 DIAGNOSIS — K080 Exfoliation of teeth due to systemic causes: Secondary | ICD-10-CM | POA: Diagnosis not present

## 2016-07-05 DIAGNOSIS — Z79899 Other long term (current) drug therapy: Secondary | ICD-10-CM | POA: Diagnosis not present

## 2016-07-05 DIAGNOSIS — E119 Type 2 diabetes mellitus without complications: Secondary | ICD-10-CM | POA: Diagnosis not present

## 2016-07-05 DIAGNOSIS — I10 Essential (primary) hypertension: Secondary | ICD-10-CM | POA: Diagnosis not present

## 2016-07-05 DIAGNOSIS — Z9884 Bariatric surgery status: Secondary | ICD-10-CM | POA: Diagnosis not present

## 2016-07-05 DIAGNOSIS — Z5181 Encounter for therapeutic drug level monitoring: Secondary | ICD-10-CM | POA: Diagnosis not present

## 2016-07-12 DIAGNOSIS — H25043 Posterior subcapsular polar age-related cataract, bilateral: Secondary | ICD-10-CM | POA: Diagnosis not present

## 2016-07-12 DIAGNOSIS — E119 Type 2 diabetes mellitus without complications: Secondary | ICD-10-CM | POA: Diagnosis not present

## 2016-07-12 DIAGNOSIS — H2513 Age-related nuclear cataract, bilateral: Secondary | ICD-10-CM | POA: Diagnosis not present

## 2016-07-13 DIAGNOSIS — F411 Generalized anxiety disorder: Secondary | ICD-10-CM | POA: Diagnosis not present

## 2016-07-13 DIAGNOSIS — H6692 Otitis media, unspecified, left ear: Secondary | ICD-10-CM | POA: Diagnosis not present

## 2016-07-13 DIAGNOSIS — J029 Acute pharyngitis, unspecified: Secondary | ICD-10-CM | POA: Diagnosis not present

## 2016-07-13 DIAGNOSIS — K080 Exfoliation of teeth due to systemic causes: Secondary | ICD-10-CM | POA: Diagnosis not present

## 2016-11-03 DIAGNOSIS — H04129 Dry eye syndrome of unspecified lacrimal gland: Secondary | ICD-10-CM | POA: Diagnosis not present

## 2016-11-03 DIAGNOSIS — H16102 Unspecified superficial keratitis, left eye: Secondary | ICD-10-CM | POA: Diagnosis not present

## 2016-11-03 DIAGNOSIS — H538 Other visual disturbances: Secondary | ICD-10-CM | POA: Diagnosis not present

## 2016-11-03 DIAGNOSIS — H524 Presbyopia: Secondary | ICD-10-CM | POA: Diagnosis not present

## 2016-12-09 DIAGNOSIS — Z23 Encounter for immunization: Secondary | ICD-10-CM | POA: Diagnosis not present

## 2017-02-03 ENCOUNTER — Other Ambulatory Visit: Payer: Self-pay | Admitting: Family

## 2017-02-03 DIAGNOSIS — Z1231 Encounter for screening mammogram for malignant neoplasm of breast: Secondary | ICD-10-CM

## 2017-02-21 ENCOUNTER — Ambulatory Visit
Admission: RE | Admit: 2017-02-21 | Discharge: 2017-02-21 | Disposition: A | Discharge status: Home / Self Care | Payer: Federal, State, Local not specified - PPO | Source: Ambulatory Visit | Attending: Family | Admitting: Family

## 2017-02-21 DIAGNOSIS — Z1231 Encounter for screening mammogram for malignant neoplasm of breast: Secondary | ICD-10-CM | POA: Diagnosis not present

## 2017-02-21 LAB — HM MAMMOGRAPHY

## 2017-02-23 DIAGNOSIS — K080 Exfoliation of teeth due to systemic causes: Secondary | ICD-10-CM | POA: Diagnosis not present

## 2017-03-23 DIAGNOSIS — K080 Exfoliation of teeth due to systemic causes: Secondary | ICD-10-CM | POA: Diagnosis not present

## 2017-03-29 DIAGNOSIS — J302 Other seasonal allergic rhinitis: Secondary | ICD-10-CM | POA: Insufficient documentation

## 2017-03-29 DIAGNOSIS — H9313 Tinnitus, bilateral: Secondary | ICD-10-CM

## 2017-03-29 DIAGNOSIS — J342 Deviated nasal septum: Secondary | ICD-10-CM | POA: Diagnosis not present

## 2017-03-29 DIAGNOSIS — H6123 Impacted cerumen, bilateral: Secondary | ICD-10-CM | POA: Insufficient documentation

## 2017-03-29 DIAGNOSIS — J343 Hypertrophy of nasal turbinates: Secondary | ICD-10-CM | POA: Diagnosis not present

## 2017-03-29 DIAGNOSIS — H903 Sensorineural hearing loss, bilateral: Secondary | ICD-10-CM | POA: Insufficient documentation

## 2017-03-29 HISTORY — DX: Impacted cerumen, bilateral: H61.23

## 2017-03-29 HISTORY — DX: Tinnitus, bilateral: H93.13

## 2017-03-30 ENCOUNTER — Encounter (HOSPITAL_COMMUNITY): Payer: Self-pay

## 2017-05-18 DIAGNOSIS — E119 Type 2 diabetes mellitus without complications: Secondary | ICD-10-CM | POA: Diagnosis not present

## 2017-05-18 DIAGNOSIS — Z79899 Other long term (current) drug therapy: Secondary | ICD-10-CM | POA: Diagnosis not present

## 2017-05-18 DIAGNOSIS — Z9884 Bariatric surgery status: Secondary | ICD-10-CM | POA: Diagnosis not present

## 2017-05-18 DIAGNOSIS — I10 Essential (primary) hypertension: Secondary | ICD-10-CM | POA: Diagnosis not present

## 2017-05-18 DIAGNOSIS — E663 Overweight: Secondary | ICD-10-CM | POA: Diagnosis not present

## 2017-05-18 DIAGNOSIS — Z5181 Encounter for therapeutic drug level monitoring: Secondary | ICD-10-CM | POA: Diagnosis not present

## 2017-09-01 DIAGNOSIS — K080 Exfoliation of teeth due to systemic causes: Secondary | ICD-10-CM | POA: Diagnosis not present

## 2017-12-04 DIAGNOSIS — M722 Plantar fascial fibromatosis: Secondary | ICD-10-CM | POA: Diagnosis not present

## 2017-12-04 DIAGNOSIS — M71571 Other bursitis, not elsewhere classified, right ankle and foot: Secondary | ICD-10-CM | POA: Diagnosis not present

## 2017-12-04 DIAGNOSIS — M7731 Calcaneal spur, right foot: Secondary | ICD-10-CM | POA: Diagnosis not present

## 2017-12-04 DIAGNOSIS — M79671 Pain in right foot: Secondary | ICD-10-CM | POA: Diagnosis not present

## 2017-12-07 ENCOUNTER — Encounter (INDEPENDENT_AMBULATORY_CARE_PROVIDER_SITE_OTHER): Payer: Federal, State, Local not specified - PPO | Admitting: Podiatry

## 2017-12-07 NOTE — Progress Notes (Signed)
This encounter was created in error - please disregard.

## 2017-12-08 DIAGNOSIS — Z23 Encounter for immunization: Secondary | ICD-10-CM | POA: Diagnosis not present

## 2017-12-15 DIAGNOSIS — Z9884 Bariatric surgery status: Secondary | ICD-10-CM | POA: Diagnosis not present

## 2018-01-01 DIAGNOSIS — H25043 Posterior subcapsular polar age-related cataract, bilateral: Secondary | ICD-10-CM | POA: Diagnosis not present

## 2018-01-01 DIAGNOSIS — Q120 Congenital cataract: Secondary | ICD-10-CM | POA: Diagnosis not present

## 2018-01-01 DIAGNOSIS — H2513 Age-related nuclear cataract, bilateral: Secondary | ICD-10-CM | POA: Diagnosis not present

## 2018-01-01 DIAGNOSIS — E119 Type 2 diabetes mellitus without complications: Secondary | ICD-10-CM | POA: Diagnosis not present

## 2018-01-11 ENCOUNTER — Encounter: Payer: Self-pay | Admitting: *Deleted

## 2018-01-11 ENCOUNTER — Other Ambulatory Visit: Payer: Self-pay | Admitting: Family Medicine

## 2018-01-11 DIAGNOSIS — Z139 Encounter for screening, unspecified: Secondary | ICD-10-CM

## 2018-02-09 DIAGNOSIS — M79674 Pain in right toe(s): Secondary | ICD-10-CM | POA: Diagnosis not present

## 2018-02-09 DIAGNOSIS — M722 Plantar fascial fibromatosis: Secondary | ICD-10-CM | POA: Diagnosis not present

## 2018-02-09 DIAGNOSIS — M79675 Pain in left toe(s): Secondary | ICD-10-CM | POA: Diagnosis not present

## 2018-02-09 DIAGNOSIS — M71571 Other bursitis, not elsewhere classified, right ankle and foot: Secondary | ICD-10-CM | POA: Diagnosis not present

## 2018-02-22 ENCOUNTER — Ambulatory Visit: Payer: Federal, State, Local not specified - PPO

## 2018-03-13 DIAGNOSIS — K080 Exfoliation of teeth due to systemic causes: Secondary | ICD-10-CM | POA: Diagnosis not present

## 2018-03-29 ENCOUNTER — Other Ambulatory Visit: Payer: Self-pay | Admitting: Family Medicine

## 2018-03-29 ENCOUNTER — Ambulatory Visit
Admission: RE | Admit: 2018-03-29 | Discharge: 2018-03-29 | Disposition: A | Discharge status: Home / Self Care | Payer: Federal, State, Local not specified - PPO | Source: Ambulatory Visit | Attending: Family Medicine | Admitting: Family Medicine

## 2018-03-29 DIAGNOSIS — H524 Presbyopia: Secondary | ICD-10-CM | POA: Diagnosis not present

## 2018-03-29 DIAGNOSIS — Z139 Encounter for screening, unspecified: Secondary | ICD-10-CM

## 2018-03-29 DIAGNOSIS — Z1231 Encounter for screening mammogram for malignant neoplasm of breast: Secondary | ICD-10-CM | POA: Diagnosis not present

## 2018-03-29 DIAGNOSIS — H25042 Posterior subcapsular polar age-related cataract, left eye: Secondary | ICD-10-CM | POA: Diagnosis not present

## 2018-03-29 DIAGNOSIS — H25041 Posterior subcapsular polar age-related cataract, right eye: Secondary | ICD-10-CM | POA: Diagnosis not present

## 2018-03-29 DIAGNOSIS — H538 Other visual disturbances: Secondary | ICD-10-CM | POA: Diagnosis not present

## 2018-04-03 ENCOUNTER — Encounter: Payer: Self-pay | Admitting: Family Medicine

## 2018-04-03 ENCOUNTER — Ambulatory Visit (INDEPENDENT_AMBULATORY_CARE_PROVIDER_SITE_OTHER): Payer: Federal, State, Local not specified - PPO | Admitting: Family Medicine

## 2018-04-03 ENCOUNTER — Other Ambulatory Visit: Payer: Self-pay

## 2018-04-03 VITALS — BP 142/82 | HR 90 | Temp 98.5°F | Resp 14 | Ht 63.0 in | Wt 162.0 lb

## 2018-04-03 DIAGNOSIS — N952 Postmenopausal atrophic vaginitis: Secondary | ICD-10-CM

## 2018-04-03 DIAGNOSIS — E1169 Type 2 diabetes mellitus with other specified complication: Secondary | ICD-10-CM | POA: Insufficient documentation

## 2018-04-03 DIAGNOSIS — C539 Malignant neoplasm of cervix uteri, unspecified: Secondary | ICD-10-CM | POA: Diagnosis not present

## 2018-04-03 DIAGNOSIS — Z0000 Encounter for general adult medical examination without abnormal findings: Secondary | ICD-10-CM | POA: Diagnosis not present

## 2018-04-03 DIAGNOSIS — E119 Type 2 diabetes mellitus without complications: Secondary | ICD-10-CM | POA: Insufficient documentation

## 2018-04-03 DIAGNOSIS — E782 Mixed hyperlipidemia: Secondary | ICD-10-CM | POA: Diagnosis not present

## 2018-04-03 DIAGNOSIS — F39 Unspecified mood [affective] disorder: Secondary | ICD-10-CM | POA: Insufficient documentation

## 2018-04-03 DIAGNOSIS — I10 Essential (primary) hypertension: Secondary | ICD-10-CM

## 2018-04-03 DIAGNOSIS — E785 Hyperlipidemia, unspecified: Secondary | ICD-10-CM | POA: Insufficient documentation

## 2018-04-03 DIAGNOSIS — E1159 Type 2 diabetes mellitus with other circulatory complications: Secondary | ICD-10-CM | POA: Insufficient documentation

## 2018-04-03 MED ORDER — LOSARTAN POTASSIUM 100 MG PO TABS: 100.0000 mg | ORAL_TABLET | Freq: Every day | ORAL | 6 refills | Status: DC

## 2018-04-03 MED ORDER — ESCITALOPRAM OXALATE 5 MG PO TABS: 5.0000 mg | ORAL_TABLET | Freq: Every day | ORAL | 6 refills | Status: DC

## 2018-04-03 MED ORDER — METOPROLOL TARTRATE 25 MG PO TABS: 25.0000 mg | ORAL_TABLET | Freq: Two times a day (BID) | ORAL | 6 refills | Status: DC

## 2018-04-03 MED ORDER — ATORVASTATIN CALCIUM 40 MG PO TABS: 40.0000 mg | ORAL_TABLET | Freq: Every day | ORAL | 6 refills | Status: DC

## 2018-04-03 NOTE — Progress Notes (Signed)
Subjective:    Patient ID: Megan Gomez, female    DOB: 09-22-1961, 57 y.o.   MRN: [id number]  Patient presents for New Patient CPE with PAP (is not fasting)  She here to establish care. Previous PCP- Dr. Antony Blackbird  Specialist: Dr. Buddy Duty - Endocrinolgy    Diagnosed DM 2007-  Has been off MTF for past 5-6 months as she couldn't       HTN - diagnosed a few years ago has been Losartan and metoprolol, has been out of meds months   No history of heart disease or palpitations   Hyperlipidemia- on lipitor 40mg    Had depression symptoms- came off prozacwhen refills did not go through, has been off a few months . But gets irrtable feels on edge at times, wants to try something else  Sensineural hearing loss per ENT   Gastric Bypass- 2013 Dr. Porfirio Mylar [phone],  Had lapband procedure   Taking MVI- centrum silver, B12 complex, Omega 3 Once a day   Calcium AND Vitamin D    Mammogram- UTD PAP Smear - due for PAP smear  Eye exam UTD- [place] off [facility] - has cataracts   Immunizations- TDAP UTD  Due for shingrix   Pneumonia shot- UTD          Review Of Systems:  GEN- denies fatigue, fever, weight loss,weakness, recent illness HEENT- denies eye drainage, change in vision, nasal discharge, CVS- denies chest pain, palpitations RESP- denies SOB, cough, wheeze ABD- denies N/V, change in stools, abd pain GU- denies dysuria, hematuria, dribbling, incontinence MSK- denies joint pain, muscle aches, injury Neuro- denies headache, dizziness, syncope, seizure activity       Objective:    BP (!) 142/82   Pulse 90   Temp 98.5 F (36.9 C) (Oral)   Resp 14   Ht 5\' 3"  (1.6 m)   Wt 162 lb (73.5 kg)   LMP 04/03/2012   SpO2 98%   BMI 28.70 kg/m  GEN- NAD, alert and oriented x3 HEENT- PERRL, EOMI, non injected sclera, pink conjunctiva, MMM, oropharynx clear Neck- Supple, no thyromegaly CVS- RRR, no murmur RESP-CTAB ABD-NABS,soft,NT,ND Nodes- no axillary  nodes GU- normal external genitalia, vaginal atrophy mucosa pink and moist, cervix some stenosis at os visualized no growth, no blood form os, minimal thin clear discharge, no CMT, no ovarian masses, uterus normal size EXT- No edema Pulses- Radial, DP- 2+        Assessment & Plan:   obtain records   Problem List Items Addressed This Visit      Unprioritized   Hyperlipidemia   Relevant Medications   losartan (COZAAR) 100 MG tablet   atorvastatin (LIPITOR) 40 MG tablet   metoprolol tartrate (LOPRESSOR) 25 MG tablet   Other Relevant Orders   Lipid panel (Completed)   Mood disorder ([place])    Start lexapro      Hypertension    Mild elevation restart meds      Relevant Medications   losartan (COZAAR) 100 MG tablet   atorvastatin (LIPITOR) 40 MG tablet   metoprolol tartrate (LOPRESSOR) 25 MG tablet   Diabetes mellitus type II, controlled ([place])    Has been well controlled Obtain records Recheck A1C today Continue MTF      Relevant Medications   losartan (COZAAR) 100 MG tablet   atorvastatin (LIPITOR) 40 MG tablet   Other Relevant Orders   CBC with Differential/Platelet (Completed)   Comprehensive metabolic panel (Completed)  Hemoglobin A1c (Completed)    Other Visit Diagnoses    Malignant neoplasm of cervix, unspecified site ([place])    -  Primary   Relevant Orders   Pap IG w/ reflex to HPV when ASC-U (Completed)   Routine general medical examination at a health care facility       CPE done, PAP SMEAR, mammo UTD, send shingles to pharmacy   Vaginal atrophy       Trial of OTC moiturizes, replens, rephresh, next step topical estrogen      Note: This dictation was prepared with Dragon dictation along with smaller phrase technology. Any transcriptional errors that result from this process are unintentional.

## 2018-04-03 NOTE — Patient Instructions (Addendum)
Release of records- [place] 5mg  once a day  Try replens or rephresh We will call with lab results  shingrix sent to pharmacy Medications refilled F/U 6 weeks

## 2018-04-04 ENCOUNTER — Encounter: Payer: Self-pay | Admitting: Family Medicine

## 2018-04-04 LAB — LIPID PANEL
Cholesterol: 188 mg/dL (ref <200)
HDL: 52 mg/dL (ref >50)
LDL CHOLESTEROL (CALC): 110 mg/dL — AB
Non-HDL Cholesterol (Calc): 136 mg/dL (calc) — ABNORMAL HIGH (ref <130)
Total CHOL/HDL Ratio: 3.6 (calc) (ref <5.0)
Triglycerides: 150 mg/dL — ABNORMAL HIGH (ref <150)

## 2018-04-04 LAB — COMPREHENSIVE METABOLIC PANEL
AG Ratio: 1.3 (calc) (ref 1.0–2.5)
ALT: 11 U/L (ref 6–29)
AST: 14 U/L (ref 10–35)
Albumin: 4 g/dL (ref 3.6–5.1)
Alkaline phosphatase (APISO): 73 U/L (ref 33–130)
BUN: 13 mg/dL (ref 7–25)
CO2: 29 mmol/L (ref 20–32)
Calcium: 9.5 mg/dL (ref 8.6–10.4)
Chloride: 102 mmol/L (ref 98–110)
Creat: 0.64 mg/dL (ref 0.50–1.05)
Globulin: 3 g/dL (calc) (ref 1.9–3.7)
Glucose, Bld: 108 mg/dL — ABNORMAL HIGH (ref 65–99)
Potassium: 3.9 mmol/L (ref 3.5–5.3)
Sodium: 139 mmol/L (ref 135–146)
Total Bilirubin: 0.7 mg/dL (ref 0.2–1.2)
Total Protein: 7 g/dL (ref 6.1–8.1)

## 2018-04-04 LAB — CBC WITH DIFFERENTIAL/PLATELET
Basophils Absolute: 78 cells/uL (ref 0–200)
Basophils Relative: 0.9 %
Eosinophils Absolute: 148 cells/uL (ref 15–500)
Eosinophils Relative: 1.7 %
HCT: 36.3 % (ref 35.0–45.0)
Hemoglobin: 12.3 g/dL (ref 11.7–15.5)
Lymphs Abs: 2184 cells/uL (ref 850–3900)
MCH: 28.1 pg (ref 27.0–33.0)
MCHC: 33.9 g/dL (ref 32.0–36.0)
MCV: 82.9 fL (ref 80.0–100.0)
MPV: 9.8 fL (ref 7.5–12.5)
Monocytes Relative: 6.6 %
Neutro Abs: 5716 cells/uL (ref 1500–7800)
Neutrophils Relative %: 65.7 %
Platelets: 323 10*3/uL (ref 140–400)
RBC: 4.38 10*6/uL (ref 3.80–5.10)
RDW: 13.2 % (ref 11.0–15.0)
Total Lymphocyte: 25.1 %
WBC mixed population: 574 cells/uL (ref 200–950)
WBC: 8.7 10*3/uL (ref 3.8–10.8)

## 2018-04-04 LAB — HEMOGLOBIN A1C
EAG (MMOL/L): 8.9 (calc)
HEMOGLOBIN A1C: 7.2 %{Hb} — AB (ref <5.7)
Mean Plasma Glucose: 160 (calc)

## 2018-04-04 LAB — PAP IG W/ RFLX HPV ASCU

## 2018-04-04 NOTE — Assessment & Plan Note (Signed)
Start lexapro 

## 2018-04-04 NOTE — Assessment & Plan Note (Signed)
Has been well controlled Obtain records Recheck A1C today Continue MTF

## 2018-04-04 NOTE — Assessment & Plan Note (Signed)
Mild elevation restart meds

## 2018-04-05 ENCOUNTER — Other Ambulatory Visit: Payer: Self-pay | Admitting: *Deleted

## 2018-04-05 MED ORDER — METFORMIN HCL 500 MG PO TABS: 500.0000 mg | ORAL_TABLET | Freq: Two times a day (BID) | ORAL | 3 refills | Status: DC

## 2018-04-05 MED ORDER — ZOSTER VAC RECOMB ADJUVANTED 50 MCG/0.5ML IM SUSR: 0.5000 mL | Freq: Once | INTRAMUSCULAR | 0 refills | Status: AC

## 2018-04-11 ENCOUNTER — Encounter: Payer: Self-pay | Admitting: *Deleted

## 2018-04-26 ENCOUNTER — Encounter: Payer: Self-pay | Admitting: *Deleted

## 2018-05-15 ENCOUNTER — Ambulatory Visit: Payer: Federal, State, Local not specified - PPO | Admitting: Family Medicine

## 2018-05-18 ENCOUNTER — Encounter: Payer: Self-pay | Admitting: Family Medicine

## 2018-06-11 ENCOUNTER — Encounter: Payer: Self-pay | Admitting: Family Medicine

## 2018-06-11 ENCOUNTER — Ambulatory Visit: Payer: Federal, State, Local not specified - PPO | Admitting: Family Medicine

## 2018-06-11 ENCOUNTER — Other Ambulatory Visit: Payer: Self-pay

## 2018-06-11 VITALS — BP 130/72 | HR 82 | Temp 98.3°F | Resp 14 | Ht 63.0 in | Wt 165.0 lb

## 2018-06-11 DIAGNOSIS — I10 Essential (primary) hypertension: Secondary | ICD-10-CM

## 2018-06-11 DIAGNOSIS — E119 Type 2 diabetes mellitus without complications: Secondary | ICD-10-CM | POA: Diagnosis not present

## 2018-06-11 DIAGNOSIS — E782 Mixed hyperlipidemia: Secondary | ICD-10-CM

## 2018-06-11 DIAGNOSIS — F39 Unspecified mood [affective] disorder: Secondary | ICD-10-CM | POA: Diagnosis not present

## 2018-06-11 MED ORDER — METOPROLOL TARTRATE 25 MG PO TABS: 25.0000 mg | ORAL_TABLET | Freq: Two times a day (BID) | ORAL | 2 refills | Status: DC

## 2018-06-11 MED ORDER — ESCITALOPRAM OXALATE 5 MG PO TABS: 5.0000 mg | ORAL_TABLET | Freq: Every day | ORAL | 2 refills | Status: DC

## 2018-06-11 MED ORDER — METFORMIN HCL 500 MG PO TABS: 500.0000 mg | ORAL_TABLET | Freq: Two times a day (BID) | ORAL | 3 refills | Status: DC

## 2018-06-11 MED ORDER — LOSARTAN POTASSIUM 100 MG PO TABS: 100.0000 mg | ORAL_TABLET | Freq: Every day | ORAL | 2 refills | Status: DC

## 2018-06-11 MED ORDER — ATORVASTATIN CALCIUM 40 MG PO TABS: 40.0000 mg | ORAL_TABLET | Freq: Every day | ORAL | 2 refills | Status: DC

## 2018-06-11 NOTE — Assessment & Plan Note (Signed)
Well controlled, no changes 

## 2018-06-11 NOTE — Progress Notes (Signed)
   Subjective:    Patient ID: Megan Gomez, female    DOB: 05/03/1961, 57 y.o.   MRN: [id number]  Patient presents for Follow-up (is not fasting)  Pt here for intermin f/u on medications.  Started on lexapro at last visit for mood disorder, doing well on the medication, feels more even keel, no Side effects, feels dose is good  Hyperlipidemia- on lipitor, no changes after last fasting labs, reviewed labs at bedside  DM- A1C 7.2%, now taking MTF 500MG  BID, fasting blood sugars low 100's, no hypoglycemia   Discussed shingrix vaccine   No new concerns   HTN- back on medications, doing well, no SE, no CP, no SOB  Drinks Keefer juice with aloe vera, helps her sleep   Review Of Systems:  GEN- denies fatigue, fever, weight loss,weakness, recent illness HEENT- denies eye drainage, change in vision, nasal discharge, CVS- denies chest pain, palpitations RESP- denies SOB, cough, wheeze ABD- denies N/V, change in stools, abd pain GU- denies dysuria, hematuria, dribbling, incontinence MSK- denies joint pain, muscle aches, injury Neuro- denies headache, dizziness, syncope, seizure activity       Objective:    BP 130/72   Pulse 82   Temp 98.3 F (36.8 C) (Oral)   Resp 14   Ht 5\' 3"  (1.6 m)   Wt 165 lb (74.8 kg)   LMP 04/03/2012   SpO2 99%   BMI 29.23 kg/m  GEN- NAD, alert and oriented x3 HEENT- PERRL, EOMI, non injected sclera, pink conjunctiva, MMM, oropharynx clear CVS- RRR, no murmur RESP-CTAB Psych- normal affect and mood EXT- No edema Pulses- Radial 2+        Assessment & Plan:      Problem List Items Addressed This Visit      Unprioritized   Diabetes mellitus type II, controlled ([place])    Continue MTF, recheck labs at next visit       Relevant Medications   atorvastatin (LIPITOR) 40 MG tablet   losartan (COZAAR) 100 MG tablet   metFORMIN (GLUCOPHAGE) 500 MG tablet   Hyperlipidemia    Gets some mild myalgias, discussed trying coenzyme q 10 with her  staatin      Relevant Medications   atorvastatin (LIPITOR) 40 MG tablet   losartan (COZAAR) 100 MG tablet   metoprolol tartrate (LOPRESSOR) 25 MG tablet   Hypertension - Primary    Well controlled, no changes       Relevant Medications   atorvastatin (LIPITOR) 40 MG tablet   losartan (COZAAR) 100 MG tablet   metoprolol tartrate (LOPRESSOR) 25 MG tablet   Mood disorder ([facility])    Doing well on lexapro, no changes         Note: This dictation was prepared with Dragon dictation along with smaller phrase technology. Any transcriptional errors that result from this process are unintentional.

## 2018-06-11 NOTE — Assessment & Plan Note (Signed)
Doing well on lexapro, no changes 

## 2018-06-11 NOTE — Patient Instructions (Signed)
F/U January Check shingles Vaccine  Coenzyme q10- 50 or 100mg  once a day

## 2018-06-11 NOTE — Assessment & Plan Note (Signed)
Gets some mild myalgias, discussed trying coenzyme q 10 with her staatin

## 2018-06-11 NOTE — Assessment & Plan Note (Signed)
Continue MTF, recheck labs at next visit

## 2018-06-12 ENCOUNTER — Other Ambulatory Visit: Payer: Self-pay | Admitting: *Deleted

## 2018-06-12 MED ORDER — ZOSTER VAC RECOMB ADJUVANTED 50 MCG/0.5ML IM SUSR: 0.5000 mL | Freq: Once | INTRAMUSCULAR | 0 refills | Status: AC

## 2018-07-04 ENCOUNTER — Telehealth: Payer: Self-pay | Admitting: *Deleted

## 2018-07-04 NOTE — Telephone Encounter (Signed)
Received call from patient.   Reports that she had discussed increased body aches/ muscle pain with MD in prior OV. States that she is concerned that the Lexapro is causing her to have increased aches.   Requested MD to recommend another medication that would not cause her aches.   MD please advise.

## 2018-07-04 NOTE — Telephone Encounter (Signed)
This medicine doesn't typically cause myalgias At our last visit we discussed her statin drug was causing symptoms and to try the coenzyme Q 10, if she has tried And still have  I recommend stopping the statin- lipitor for 2-3 weeks first and see if symptoms go away Before stopping the lexapro as she had been doing very well with this medication

## 2018-07-05 NOTE — Telephone Encounter (Signed)
Call placed to patient. LMTRC.  

## 2018-07-06 NOTE — Telephone Encounter (Signed)
Call placed to patient. LMTRC.  

## 2018-07-09 NOTE — Telephone Encounter (Signed)
Call placed to patient. LMTRC.  

## 2018-07-09 NOTE — Telephone Encounter (Signed)
Call placed to patient and patient made aware.   States that she did stop Lexapro x2 weeks and cramps resolved. States that when she began medication again, cramps returned.   States that she would like MD to recommend another medication for mood swings. Advised OV would be required. Appointment scheduled for 07/31/2018.

## 2018-07-09 NOTE — Telephone Encounter (Signed)
Start zoloft 25mg  x2weeks, then increase to 50mg   Stop the lexapro, keep appt for september

## 2018-07-10 NOTE — Telephone Encounter (Signed)
Call placed to patient. LMTRC.  

## 2018-07-11 NOTE — Telephone Encounter (Signed)
Multiple calls placed to patient with no answer and no return call.   Message to be closed.  

## 2018-07-31 ENCOUNTER — Ambulatory Visit: Payer: Self-pay | Admitting: Family Medicine

## 2018-08-07 DIAGNOSIS — M9901 Segmental and somatic dysfunction of cervical region: Secondary | ICD-10-CM | POA: Diagnosis not present

## 2018-08-07 DIAGNOSIS — M7501 Adhesive capsulitis of right shoulder: Secondary | ICD-10-CM | POA: Diagnosis not present

## 2018-08-07 DIAGNOSIS — M25511 Pain in right shoulder: Secondary | ICD-10-CM | POA: Diagnosis not present

## 2018-08-07 DIAGNOSIS — M6248 Contracture of muscle, other site: Secondary | ICD-10-CM | POA: Diagnosis not present

## 2018-08-14 ENCOUNTER — Telehealth: Payer: Self-pay | Admitting: Family Medicine

## 2018-08-14 NOTE — Telephone Encounter (Signed)
Call placed to patient.   Reports that she did not stop Lexapro nor did she start Zoloft. States thst hse did go to chiropractor and myalgias were improved. Thinks she had pinched nerve.

## 2018-08-14 NOTE — Telephone Encounter (Signed)
Pt needs you to call her back about closed phone encounter on 07/04/2018.

## 2018-08-20 DIAGNOSIS — M25511 Pain in right shoulder: Secondary | ICD-10-CM | POA: Diagnosis not present

## 2018-08-20 DIAGNOSIS — M419 Scoliosis, unspecified: Secondary | ICD-10-CM | POA: Diagnosis not present

## 2018-08-20 DIAGNOSIS — Z9181 History of falling: Secondary | ICD-10-CM | POA: Diagnosis not present

## 2018-08-20 DIAGNOSIS — M47814 Spondylosis without myelopathy or radiculopathy, thoracic region: Secondary | ICD-10-CM | POA: Diagnosis not present

## 2018-08-20 DIAGNOSIS — M47817 Spondylosis without myelopathy or radiculopathy, lumbosacral region: Secondary | ICD-10-CM | POA: Diagnosis not present

## 2018-08-28 DIAGNOSIS — M9901 Segmental and somatic dysfunction of cervical region: Secondary | ICD-10-CM | POA: Diagnosis not present

## 2018-08-28 DIAGNOSIS — M6248 Contracture of muscle, other site: Secondary | ICD-10-CM | POA: Diagnosis not present

## 2018-08-28 DIAGNOSIS — M7501 Adhesive capsulitis of right shoulder: Secondary | ICD-10-CM | POA: Diagnosis not present

## 2018-08-28 DIAGNOSIS — M25511 Pain in right shoulder: Secondary | ICD-10-CM | POA: Diagnosis not present

## 2018-10-03 DIAGNOSIS — M7501 Adhesive capsulitis of right shoulder: Secondary | ICD-10-CM | POA: Diagnosis not present

## 2018-10-03 DIAGNOSIS — M6248 Contracture of muscle, other site: Secondary | ICD-10-CM | POA: Diagnosis not present

## 2018-10-03 DIAGNOSIS — M9901 Segmental and somatic dysfunction of cervical region: Secondary | ICD-10-CM | POA: Diagnosis not present

## 2018-10-03 DIAGNOSIS — M25511 Pain in right shoulder: Secondary | ICD-10-CM | POA: Diagnosis not present

## 2018-10-08 DIAGNOSIS — M6248 Contracture of muscle, other site: Secondary | ICD-10-CM | POA: Diagnosis not present

## 2018-10-08 DIAGNOSIS — M9901 Segmental and somatic dysfunction of cervical region: Secondary | ICD-10-CM | POA: Diagnosis not present

## 2018-10-08 DIAGNOSIS — M7501 Adhesive capsulitis of right shoulder: Secondary | ICD-10-CM | POA: Diagnosis not present

## 2018-10-08 DIAGNOSIS — M25511 Pain in right shoulder: Secondary | ICD-10-CM | POA: Diagnosis not present

## 2018-10-09 DIAGNOSIS — M25511 Pain in right shoulder: Secondary | ICD-10-CM | POA: Diagnosis not present

## 2018-10-09 DIAGNOSIS — M9901 Segmental and somatic dysfunction of cervical region: Secondary | ICD-10-CM | POA: Diagnosis not present

## 2018-10-09 DIAGNOSIS — M7501 Adhesive capsulitis of right shoulder: Secondary | ICD-10-CM | POA: Diagnosis not present

## 2018-10-09 DIAGNOSIS — M6248 Contracture of muscle, other site: Secondary | ICD-10-CM | POA: Diagnosis not present

## 2018-12-17 ENCOUNTER — Ambulatory Visit: Payer: Federal, State, Local not specified - PPO | Admitting: Family Medicine

## 2018-12-24 ENCOUNTER — Encounter: Payer: Self-pay | Admitting: Family Medicine

## 2019-04-05 ENCOUNTER — Other Ambulatory Visit: Payer: Self-pay | Admitting: Family Medicine

## 2019-04-07 ENCOUNTER — Other Ambulatory Visit: Payer: Self-pay | Admitting: Family Medicine

## 2019-05-22 ENCOUNTER — Encounter: Payer: Federal, State, Local not specified - PPO | Admitting: Family Medicine

## 2019-06-05 ENCOUNTER — Encounter: Payer: Federal, State, Local not specified - PPO | Admitting: Family Medicine

## 2019-06-06 ENCOUNTER — Other Ambulatory Visit: Payer: Self-pay | Admitting: Family Medicine

## 2019-06-17 ENCOUNTER — Other Ambulatory Visit: Payer: Self-pay | Admitting: Family Medicine

## 2019-08-03 ENCOUNTER — Other Ambulatory Visit: Payer: Self-pay | Admitting: Family Medicine

## 2019-10-07 DIAGNOSIS — H43822 Vitreomacular adhesion, left eye: Secondary | ICD-10-CM | POA: Diagnosis not present

## 2019-10-07 DIAGNOSIS — Q120 Congenital cataract: Secondary | ICD-10-CM | POA: Diagnosis not present

## 2019-10-07 DIAGNOSIS — H43821 Vitreomacular adhesion, right eye: Secondary | ICD-10-CM | POA: Diagnosis not present

## 2019-10-07 DIAGNOSIS — E119 Type 2 diabetes mellitus without complications: Secondary | ICD-10-CM | POA: Diagnosis not present

## 2019-10-07 DIAGNOSIS — H25043 Posterior subcapsular polar age-related cataract, bilateral: Secondary | ICD-10-CM | POA: Diagnosis not present

## 2019-10-07 DIAGNOSIS — H2513 Age-related nuclear cataract, bilateral: Secondary | ICD-10-CM | POA: Diagnosis not present

## 2019-10-12 DIAGNOSIS — K080 Exfoliation of teeth due to systemic causes: Secondary | ICD-10-CM | POA: Diagnosis not present

## 2019-11-07 ENCOUNTER — Encounter: Payer: Self-pay | Admitting: *Deleted

## 2019-11-07 ENCOUNTER — Other Ambulatory Visit: Payer: Self-pay | Admitting: *Deleted

## 2019-11-07 MED ORDER — ATORVASTATIN CALCIUM 40 MG PO TABS: ORAL_TABLET | ORAL | 0 refills | Status: DC

## 2019-11-07 MED ORDER — LOSARTAN POTASSIUM 100 MG PO TABS: ORAL_TABLET | ORAL | 0 refills | Status: DC

## 2019-11-07 MED ORDER — METOPROLOL TARTRATE 25 MG PO TABS: ORAL_TABLET | ORAL | 0 refills | Status: DC

## 2019-12-11 ENCOUNTER — Other Ambulatory Visit: Payer: Self-pay | Admitting: Family Medicine

## 2019-12-25 DIAGNOSIS — Z20828 Contact with and (suspected) exposure to other viral communicable diseases: Secondary | ICD-10-CM | POA: Diagnosis not present

## 2020-01-15 DIAGNOSIS — L820 Inflamed seborrheic keratosis: Secondary | ICD-10-CM | POA: Diagnosis not present

## 2020-01-19 ENCOUNTER — Other Ambulatory Visit: Payer: Self-pay | Admitting: Family Medicine

## 2020-01-20 ENCOUNTER — Other Ambulatory Visit: Payer: Self-pay | Admitting: Family Medicine

## 2020-02-12 DIAGNOSIS — Z01419 Encounter for gynecological examination (general) (routine) without abnormal findings: Secondary | ICD-10-CM | POA: Diagnosis not present

## 2020-02-16 ENCOUNTER — Other Ambulatory Visit: Payer: Self-pay | Admitting: Family Medicine

## 2020-04-06 DIAGNOSIS — M19072 Primary osteoarthritis, left ankle and foot: Secondary | ICD-10-CM | POA: Diagnosis not present

## 2020-04-06 DIAGNOSIS — M67371 Transient synovitis, right ankle and foot: Secondary | ICD-10-CM | POA: Diagnosis not present

## 2020-04-06 DIAGNOSIS — M19071 Primary osteoarthritis, right ankle and foot: Secondary | ICD-10-CM | POA: Diagnosis not present

## 2020-04-06 DIAGNOSIS — M67372 Transient synovitis, left ankle and foot: Secondary | ICD-10-CM | POA: Diagnosis not present

## 2020-04-08 ENCOUNTER — Other Ambulatory Visit: Payer: Self-pay | Admitting: Family Medicine

## 2020-04-08 DIAGNOSIS — Z1231 Encounter for screening mammogram for malignant neoplasm of breast: Secondary | ICD-10-CM

## 2020-04-20 DIAGNOSIS — Z1231 Encounter for screening mammogram for malignant neoplasm of breast: Secondary | ICD-10-CM | POA: Diagnosis not present

## 2020-04-20 DIAGNOSIS — Z13820 Encounter for screening for osteoporosis: Secondary | ICD-10-CM | POA: Diagnosis not present

## 2020-04-29 DIAGNOSIS — Z9884 Bariatric surgery status: Secondary | ICD-10-CM | POA: Diagnosis not present

## 2020-05-13 DIAGNOSIS — M67431 Ganglion, right wrist: Secondary | ICD-10-CM

## 2020-05-13 HISTORY — DX: Ganglion, right wrist: M67.431

## 2020-05-14 ENCOUNTER — Other Ambulatory Visit: Payer: Self-pay | Admitting: Orthopedic Surgery

## 2020-05-14 DIAGNOSIS — M67431 Ganglion, right wrist: Secondary | ICD-10-CM

## 2020-05-16 ENCOUNTER — Other Ambulatory Visit: Payer: Self-pay | Admitting: Family Medicine

## 2020-05-19 ENCOUNTER — Other Ambulatory Visit: Payer: Self-pay | Admitting: Family Medicine

## 2020-05-20 ENCOUNTER — Other Ambulatory Visit: Payer: Self-pay | Admitting: Family Medicine

## 2020-05-25 ENCOUNTER — Other Ambulatory Visit: Payer: Federal, State, Local not specified - PPO

## 2020-06-12 ENCOUNTER — Other Ambulatory Visit: Payer: Federal, State, Local not specified - PPO

## 2020-06-15 ENCOUNTER — Other Ambulatory Visit: Payer: Self-pay | Admitting: Family Medicine

## 2020-06-20 DIAGNOSIS — Z20822 Contact with and (suspected) exposure to covid-19: Secondary | ICD-10-CM | POA: Diagnosis not present

## 2020-08-10 ENCOUNTER — Other Ambulatory Visit: Payer: Self-pay

## 2020-08-10 DIAGNOSIS — Z20822 Contact with and (suspected) exposure to covid-19: Secondary | ICD-10-CM | POA: Diagnosis not present

## 2020-08-14 ENCOUNTER — Other Ambulatory Visit: Payer: Self-pay | Admitting: Family Medicine

## 2020-08-26 ENCOUNTER — Ambulatory Visit (INDEPENDENT_AMBULATORY_CARE_PROVIDER_SITE_OTHER): Payer: Federal, State, Local not specified - PPO | Admitting: Family Medicine

## 2020-08-26 ENCOUNTER — Encounter: Payer: Self-pay | Admitting: Family Medicine

## 2020-08-26 ENCOUNTER — Other Ambulatory Visit: Payer: Self-pay

## 2020-08-26 VITALS — BP 157/99 | HR 68 | Temp 97.5°F | Ht 63.0 in | Wt 167.0 lb

## 2020-08-26 DIAGNOSIS — Z0000 Encounter for general adult medical examination without abnormal findings: Secondary | ICD-10-CM

## 2020-08-26 DIAGNOSIS — Z1159 Encounter for screening for other viral diseases: Secondary | ICD-10-CM

## 2020-08-26 DIAGNOSIS — Z114 Encounter for screening for human immunodeficiency virus [HIV]: Secondary | ICD-10-CM

## 2020-08-26 DIAGNOSIS — I10 Essential (primary) hypertension: Secondary | ICD-10-CM | POA: Diagnosis not present

## 2020-08-26 DIAGNOSIS — E119 Type 2 diabetes mellitus without complications: Secondary | ICD-10-CM | POA: Diagnosis not present

## 2020-08-26 DIAGNOSIS — F39 Unspecified mood [affective] disorder: Secondary | ICD-10-CM

## 2020-08-26 DIAGNOSIS — E782 Mixed hyperlipidemia: Secondary | ICD-10-CM | POA: Diagnosis not present

## 2020-08-26 DIAGNOSIS — M67431 Ganglion, right wrist: Secondary | ICD-10-CM

## 2020-08-26 DIAGNOSIS — Z23 Encounter for immunization: Secondary | ICD-10-CM | POA: Diagnosis not present

## 2020-08-26 MED ORDER — METOPROLOL TARTRATE 25 MG PO TABS
ORAL_TABLET | ORAL | 1 refills | Status: DC
Start: 2020-08-26 — End: 2021-11-08

## 2020-08-26 MED ORDER — LOSARTAN POTASSIUM 100 MG PO TABS
ORAL_TABLET | ORAL | 1 refills | Status: DC
Start: 2020-08-26 — End: 2021-02-19

## 2020-08-26 MED ORDER — ATORVASTATIN CALCIUM 40 MG PO TABS
ORAL_TABLET | ORAL | 1 refills | Status: DC
Start: 2020-08-26 — End: 2021-02-19

## 2020-08-26 MED ORDER — ESCITALOPRAM OXALATE 5 MG PO TABS
ORAL_TABLET | ORAL | 1 refills | Status: DC
Start: 2020-08-26 — End: 2021-02-19

## 2020-08-26 NOTE — Progress Notes (Signed)
Patient: Megan Gomez MRN: [id number] DOB: Mar 27, 1961 PCP: Alycia Rossetti, MD     Subjective:  Chief Complaint  Patient presents with  . Annual Exam  . Hypertension  . Diabetes  . Hyperlipidemia    HPI: The patient is a 59 y.o. female who presents today for annual exam. She denies any changes to past medical history. There have been no recent hospitalizations. They are not following a well balanced diet and exercise plan. Weight has been increasing rapidly. She complains of a cyst on her right wrist.   Sister had breast cancer. No colon cancer.   Hypertension: Here for follow up of hypertension.  Currently on losaratn 100mg  daily and lopressor 25mg /day. She has not been taken her medication regularly. She did not take any medication this AM. Takes medication as prescribed and denies any side effects. Exercise includes walking. Weight has been increasing. Denies any chest pain, headaches, shortness of breath, vision changes, swelling in lower extremities.   Diabetes: Patient is here for follow up of type 2 diabetes. First diagnosed years ago. Had gestational diabetes around 2001 and has continued to have diabetes.  Currently on the following medications metformin 500mg  BID. Takes medications as prescribed. Last A1C was 6.8 (2 years ago). Currently exercising and following diabetic diet. Denies any hypoglycemic events. Denies any vision changes, nausea, vomiting, abdominal pain, ulcers/paraesthesia in feet, polyuria, polydipsia or polyphagia. Denies any chest pain, shortness of breath. She has been out of medication as she hasn't been seen due to covid.   Hyperlipidemia -currently on lipitor 40mg . She has diabetes.  Her father had a stroke. She has a remote history of smoking.   Mood disorder She states she has anger issues. She is currently on lexapro. She has been on this medication x 1 year. She feels like her symptoms are well controlled. She denies any depression/anxiety. She  has an obsessive husband that makes her symptoms worse.   Cyst ganglion Noticed on right dorsal wrist about 3-6 months ago. Would like to know what this is. Not hurting her or bothering her.    Immunization History  Administered Date(s) Administered  . Influenza Inj Mdck Quad Pf 12/08/2017  . Influenza,inj,Quad PF,6+ Mos 08/26/2020  . PFIZER SARS-COV-2 Vaccination 02/01/2020, 02/22/2020  . Pneumococcal Polysaccharide-23 12/21/2010  . Zoster Recombinat (Shingrix) 06/12/2018, 08/22/2018   Colonoscopy: 2017 Mammogram: 01/2020 Pap smear: 01/2020. Normal    Review of Systems  Constitutional: Negative for chills, fatigue and fever.  HENT: Negative for dental problem, ear pain, hearing loss and trouble swallowing.   Eyes: Negative for visual disturbance.  Respiratory: Negative for cough, chest tightness and shortness of breath.   Cardiovascular: Negative for chest pain, palpitations and leg swelling.  Gastrointestinal: Negative for abdominal pain, blood in stool, diarrhea and nausea.  Endocrine: Negative for cold intolerance, polydipsia, polyphagia and polyuria.  Genitourinary: Negative for dysuria and hematuria.  Musculoskeletal: Negative for arthralgias.       Cyst on wrist   Skin: Negative for rash.  Neurological: Negative for dizziness and headaches.  Psychiatric/Behavioral: Negative for dysphoric mood and sleep disturbance. The patient is not nervous/anxious.     Allergies Patient is allergic to cephalexin, chlorhexidine gluconate, demerol, and stadol [butorphanol tartrate].  Past Medical History Patient  has a past medical history of Diabetes mellitus, Hyperlipidemia, Hypertension, and Rotator cuff tear.  Surgical History Patient  has a past surgical history that includes Cesarean section (2001, 2005); Inner ear surgery (1998); Breath tek h pylori (04/04/2012); Eye surgery (1995); Laparoscopic  gastric banding (08/28/2012); and Breast excisional biopsy (Left).  Family  History Pateint's family history includes Breast cancer in her sister; Cancer in her sister; Diabetes in her brother, mother, and sister; Stroke in her father.  Social History Patient  reports that she quit smoking about 20 years ago. Her smoking use included cigarettes. She has a 10.00 pack-year smoking history. She has never used smokeless tobacco. She reports current alcohol use. She reports that she does not use drugs.    Objective: Vitals:   08/26/20 0845  BP: (!) 157/99  Pulse: 68  Temp: (!) 97.5 F (36.4 C)  TempSrc: Temporal  SpO2: 96%  Weight: 167 lb (75.8 kg)  Height: 5\' 3"  (1.6 m)    Body mass index is 29.58 kg/m.  Physical Exam Vitals reviewed.  Constitutional:      Appearance: Normal appearance. She is well-developed. She is obese.  HENT:     Head: Normocephalic and atraumatic.     Right Ear: Tympanic membrane, ear canal and external ear normal.     Left Ear: Tympanic membrane, ear canal and external ear normal.     Nose: Nose normal.     Mouth/Throat:     Mouth: Mucous membranes are moist.  Eyes:     Conjunctiva/sclera: Conjunctivae normal.     Pupils: Pupils are equal, round, and reactive to light.  Neck:     Thyroid: No thyromegaly.  Cardiovascular:     Rate and Rhythm: Normal rate and regular rhythm.     Pulses: Normal pulses.     Heart sounds: Normal heart sounds. No murmur heard.   Pulmonary:     Effort: Pulmonary effort is normal.     Breath sounds: Normal breath sounds.  Abdominal:     General: Bowel sounds are normal. There is no distension.     Palpations: Abdomen is soft.     Tenderness: There is no abdominal tenderness.  Musculoskeletal:     Cervical back: Normal range of motion and neck supple.     Comments: Ganglion cyst of right dorsum   Lymphadenopathy:     Cervical: No cervical adenopathy.  Skin:    General: Skin is warm and dry.     Capillary Refill: Capillary refill takes less than 2 seconds.     Findings: No rash.   Neurological:     General: No focal deficit present.     Mental Status: She is alert and oriented to person, place, and time.     Cranial Nerves: No cranial nerve deficit.     Coordination: Coordination normal.     Deep Tendon Reflexes: Reflexes normal.  Psychiatric:        Mood and Affect: Mood normal.        Behavior: Behavior normal.      Office Visit from 08/26/2020 in [place]  PHQ-2 Total Score 0         Assessment/plan: 1. Essential hypertension Above goal. Has not taken medication. Start back her cozaar 100mg /day. Weaning her off metoprolol since 4th line with no indication to use. Will see her back in 1 month and adjust medication as needed. Encouraged low salt diet and exercise.  - CBC with Differential/Platelet; Future - COMPLETE METABOLIC PANEL WITH GFR; Future  2. Controlled type 2 diabetes mellitus without complication, without long-term current use of insulin (HCC) Checking a1c. Has been out of medication. May need to increase dose of metformin, but will see where she is at.  -had pneumonia and  covid shots -on statin -foot exam follow up and ask about eyes.   - Hemoglobin A1c; Future - Microalbumin / creatinine urine ratio; Future  3. Mixed hyperlipidemia  - Lipid panel; Future  4. Mood disorder ([place]) Well controlled on medication. Px sent in for her.   5. Encounter for hepatitis C screening test for low risk patient  - Hepatitis C antibody; Future  6. Annual physical exam Routine fasting labs. HM addressed. Discussed flu shot. Asked her to look for tdap. Has had covid shots. Encouraged exercise. F/u in one year or as needed.  Patient counseling [x]    Nutrition: Stressed importance of moderation in sodium/caffeine intake, saturated fat and cholesterol, caloric balance, sufficient intake of fresh fruits, vegetables, fiber, calcium, iron, and 1 mg of folate supplement per day (for females capable of pregnancy).  [x]    Stressed  the importance of regular exercise.   []    Substance Abuse: Discussed cessation/primary prevention of tobacco, alcohol, or other drug use; driving or other dangerous activities under the influence; availability of treatment for abuse.   [x]    Injury prevention: Discussed safety belts, safety helmets, smoke detector, smoking near bedding or upholstery.   [x]    Sexuality: Discussed sexually transmitted diseases, partner selection, use of condoms, avoidance of unintended pregnancy  and contraceptive alternatives.  [x]    Dental health: Discussed importance of regular tooth brushing, flossing, and dental visits.  [x]    Health maintenance and immunizations reviewed. Please refer to Health maintenance section.    - TSH; Future  7. Encounter for screening for HIV  - HIV Antibody (routine testing w rflx); Future  8. Ganglion cyst of dorsum of right wrist No pain and small. Watchful waiting, but if it starts to grow or bother her we can send to ortho.   This visit occurred during the SARS-CoV-2 public health emergency.  Safety protocols were in place, including screening questions prior to the visit, additional usage of staff PPE, and extensive cleaning of exam room while observing appropriate contact time as indicated for disinfecting solutions.     Return in about 1 month (around 09/25/2020) for blood pressure .    Orma Flaming, MD [place]  08/26/2020

## 2020-08-26 NOTE — Patient Instructions (Addendum)
I want to wean you off the metoprolol. It's fourth line for blood pressure and use if indicated for heart disease. Will take one a day for 7 days then stop.   -continue losartan 100mg /day. I may need to add on other medication. Will see you back in a month for recheck.   -all other meds filled except metformin. Let me see what your diabetes is doing.   -cyst on wrist is called a ganglion cyst. If not bothering you we can leave. If starts to hurt, grow, etc. Just let me know and i'll send to ortho for removal.   So nice to meet you!   Dr. Rogers Blocker

## 2020-08-27 ENCOUNTER — Other Ambulatory Visit: Payer: Self-pay | Admitting: Family Medicine

## 2020-08-27 LAB — CBC WITH DIFFERENTIAL/PLATELET
Absolute Monocytes: 360 cells/uL (ref 200–950)
Basophils Absolute: 89 cells/uL (ref 0–200)
Basophils Relative: 1.5 %
Eosinophils Absolute: 301 cells/uL (ref 15–500)
Eosinophils Relative: 5.1 %
HCT: 41.2 % (ref 35.0–45.0)
Hemoglobin: 13.5 g/dL (ref 11.7–15.5)
Lymphs Abs: 1263 cells/uL (ref 850–3900)
MCH: 28.4 pg (ref 27.0–33.0)
MCHC: 32.8 g/dL (ref 32.0–36.0)
MCV: 86.7 fL (ref 80.0–100.0)
MPV: 9.2 fL (ref 7.5–12.5)
Monocytes Relative: 6.1 %
Neutro Abs: 3888 cells/uL (ref 1500–7800)
Neutrophils Relative %: 65.9 %
Platelets: 290 10*3/uL (ref 140–400)
RBC: 4.75 10*6/uL (ref 3.80–5.10)
RDW: 12.4 % (ref 11.0–15.0)
Total Lymphocyte: 21.4 %
WBC: 5.9 10*3/uL (ref 3.8–10.8)

## 2020-08-27 LAB — COMPLETE METABOLIC PANEL WITH GFR
AG Ratio: 1.5 (calc) (ref 1.0–2.5)
ALT: 10 U/L (ref 6–29)
AST: 14 U/L (ref 10–35)
Albumin: 4.1 g/dL (ref 3.6–5.1)
Alkaline phosphatase (APISO): 78 U/L (ref 37–153)
BUN: 11 mg/dL (ref 7–25)
CO2: 26 mmol/L (ref 20–32)
Calcium: 8.7 mg/dL (ref 8.6–10.4)
Chloride: 104 mmol/L (ref 98–110)
Creat: 0.72 mg/dL (ref 0.50–0.99)
GFR, Est African American: 105 mL/min/{1.73_m2} (ref >=60)
GFR, Est Non African American: 91 mL/min/{1.73_m2} (ref >=60)
Globulin: 2.7 g/dL (calc) (ref 1.9–3.7)
Glucose, Bld: 114 mg/dL — ABNORMAL HIGH (ref 65–99)
Potassium: 4 mmol/L (ref 3.5–5.3)
Sodium: 140 mmol/L (ref 135–146)
Total Bilirubin: 0.3 mg/dL (ref 0.2–1.2)
Total Protein: 6.8 g/dL (ref 6.1–8.1)

## 2020-08-27 LAB — LIPID PANEL
Cholesterol: 248 mg/dL — ABNORMAL HIGH (ref <200)
HDL: 63 mg/dL (ref >=50)
LDL Cholesterol (Calc): 161 mg/dL (calc) — ABNORMAL HIGH
Non-HDL Cholesterol (Calc): 185 mg/dL (calc) — ABNORMAL HIGH (ref <130)
Total CHOL/HDL Ratio: 3.9 (calc) (ref <5.0)
Triglycerides: 121 mg/dL (ref <150)

## 2020-08-27 LAB — HEMOGLOBIN A1C
Hgb A1c MFr Bld: 7 % of total Hgb — ABNORMAL HIGH (ref <5.7)
Mean Plasma Glucose: 154 (calc)
eAG (mmol/L): 8.5 (calc)

## 2020-08-27 LAB — HEPATITIS C ANTIBODY
Hepatitis C Ab: NONREACTIVE
SIGNAL TO CUT-OFF: 0.01 (ref <1.00)

## 2020-08-27 LAB — MICROALBUMIN / CREATININE URINE RATIO
Creatinine, Urine: 83 mg/dL (ref 20–275)
Microalb Creat Ratio: 13 mcg/mg creat (ref <30)
Microalb, Ur: 1.1 mg/dL

## 2020-08-27 LAB — TSH: TSH: 1.51 mIU/L (ref 0.40–4.50)

## 2020-08-27 LAB — HIV ANTIBODY (ROUTINE TESTING W REFLEX): HIV 1&2 Ab, 4th Generation: NONREACTIVE

## 2020-08-27 MED ORDER — METFORMIN HCL 500 MG PO TABS
500.0000 mg | ORAL_TABLET | Freq: Two times a day (BID) | ORAL | 1 refills | Status: DC
Start: 2020-08-27 — End: 2021-02-22

## 2020-09-09 ENCOUNTER — Ambulatory Visit
Admission: RE | Admit: 2020-09-09 | Discharge: 2020-09-09 | Disposition: A | Discharge status: Home / Self Care | Payer: Federal, State, Local not specified - PPO | Source: Ambulatory Visit | Attending: Orthopedic Surgery | Admitting: Orthopedic Surgery

## 2020-09-09 DIAGNOSIS — M67431 Ganglion, right wrist: Secondary | ICD-10-CM

## 2020-09-28 ENCOUNTER — Other Ambulatory Visit: Payer: Self-pay

## 2020-09-28 ENCOUNTER — Ambulatory Visit: Payer: Self-pay | Admitting: Family Medicine

## 2020-09-28 ENCOUNTER — Ambulatory Visit: Payer: Federal, State, Local not specified - PPO | Admitting: Family Medicine

## 2020-09-28 ENCOUNTER — Encounter: Payer: Self-pay | Admitting: Family Medicine

## 2020-09-28 VITALS — BP 144/80 | HR 78 | Temp 98.5°F | Ht 63.0 in | Wt 166.8 lb

## 2020-09-28 DIAGNOSIS — I10 Essential (primary) hypertension: Secondary | ICD-10-CM

## 2020-09-28 MED ORDER — HYDROCHLOROTHIAZIDE 12.5 MG PO TABS: 12.5000 mg | ORAL_TABLET | Freq: Every day | ORAL | 3 refills | Status: DC

## 2020-09-28 NOTE — Progress Notes (Signed)
Patient: Megan Gomez MRN: [id number] DOB: 1960-12-20 PCP: Orma Flaming, MD     Subjective:  Chief Complaint  Patient presents with   Hypertension    HPI: The patient is a 59 y.o. female who presents today for HTN. She has no concerns today.  Hypertension: Here for follow up of hypertension.  Currently on losaratn 100mg  daily and I weaned her off of the lopressor.  I saw her a month ago and she had not been on her medication. We re started this back.  Has not taken her blood pressure at home. takes medication as prescribed and denies any side effects. Exercise includes walking. Weight has been increasing. Denies any chest pain, headaches, shortness of breath, vision changes, swelling in lower extremities.   Diabetes Her a1c was 7.0. she was off her metformin and we re started this back at her appointment. Taking as prescribed.   Review of Systems  Constitutional: Negative for appetite change.  Respiratory: Negative for shortness of breath.   Cardiovascular: Negative for chest pain and leg swelling.  Genitourinary: Negative for flank pain and urgency.  Neurological: Negative for dizziness and headaches.    Allergies Patient is allergic to cephalexin, chlorhexidine gluconate, demerol, and stadol [butorphanol tartrate].  Past Medical History Patient  has a past medical history of Diabetes mellitus, Hyperlipidemia, Hypertension, and Rotator cuff tear.  Surgical History Patient  has a past surgical history that includes Cesarean section (2001, 2005); Inner ear surgery (1998); Breath tek h pylori (04/04/2012); Eye surgery (1995); Laparoscopic gastric banding (08/28/2012); and Breast excisional biopsy (Left).  Family History Pateint's family history includes Breast cancer in her sister; Cancer in her sister; Diabetes in her brother, mother, and sister; Stroke in her father.  Social History Patient  reports that she quit smoking about 20 years ago. Her smoking use included  cigarettes. She has a 10.00 pack-year smoking history. She has never used smokeless tobacco. She reports current alcohol use. She reports that she does not use drugs.    Objective: Vitals:   09/28/20 1039  BP: (!) 144/80  Pulse: 78  Temp: 98.5 F (36.9 C)  TempSrc: Temporal  SpO2: 99%  Weight: 166 lb 12.8 oz (75.7 kg)  Height: 5\' 3"  (1.6 m)    Body mass index is 29.55 kg/m.  Physical Exam Vitals reviewed.  Constitutional:      Appearance: Normal appearance. She is obese.  HENT:     Head: Normocephalic and atraumatic.  Cardiovascular:     Rate and Rhythm: Normal rate and regular rhythm.     Heart sounds: Normal heart sounds.  Pulmonary:     Effort: Pulmonary effort is normal.     Breath sounds: Normal breath sounds.  Abdominal:     General: Bowel sounds are normal.     Palpations: Abdomen is soft.  Neurological:     General: No focal deficit present.     Mental Status: She is alert and oriented to person, place, and time.        Assessment/plan: 1. Primary hypertension Much better than last visit, but still want her lower. We are going to continue her losartan 100mg  and start her on hctz 12.5mg /daily. goal is less than 130/80. I do want her to try and keep a log for me so we can see how she is doing. F/u in 3 months with labs, monitor K+ and diabetes follow up.    This visit occurred during the SARS-CoV-2 public health emergency.  Safety protocols were in place, including  screening questions prior to the visit, additional usage of staff PPE, and extensive cleaning of exam room while observing appropriate contact time as indicated for disinfecting solutions.    Return in about 3 months (around 12/29/2020) for diabetes/htn.   Orma Flaming, MD [place]   09/28/2020

## 2020-09-28 NOTE — Patient Instructions (Signed)
Blood pressure much better, but I want it less than 130/80. Continue your losartan 100mg /day. Im going to send in a medication called hydrochlorothiazide that you will take once a day. It's a diuretic and may make you urinate more, but it's very low dose. Try to keep a log at home for me so I can see what your pressures are running.   See you back in 3 months for routine diabetes check and we can f/u on blood pressure then.   Proud of you!  Dr. Rogers Blocker

## 2020-10-06 DIAGNOSIS — H43823 Vitreomacular adhesion, bilateral: Secondary | ICD-10-CM | POA: Insufficient documentation

## 2020-10-06 DIAGNOSIS — H2513 Age-related nuclear cataract, bilateral: Secondary | ICD-10-CM | POA: Insufficient documentation

## 2020-10-06 DIAGNOSIS — H43821 Vitreomacular adhesion, right eye: Secondary | ICD-10-CM | POA: Insufficient documentation

## 2020-10-06 DIAGNOSIS — H43822 Vitreomacular adhesion, left eye: Secondary | ICD-10-CM

## 2020-10-06 DIAGNOSIS — H25043 Posterior subcapsular polar age-related cataract, bilateral: Secondary | ICD-10-CM | POA: Insufficient documentation

## 2020-10-06 HISTORY — DX: Vitreomacular adhesion, right eye: H43.821

## 2020-10-06 HISTORY — DX: Vitreomacular adhesion, left eye: H43.822

## 2020-10-07 ENCOUNTER — Other Ambulatory Visit: Payer: Self-pay

## 2020-10-07 ENCOUNTER — Ambulatory Visit (INDEPENDENT_AMBULATORY_CARE_PROVIDER_SITE_OTHER): Payer: Federal, State, Local not specified - PPO | Admitting: Ophthalmology

## 2020-10-07 ENCOUNTER — Encounter (INDEPENDENT_AMBULATORY_CARE_PROVIDER_SITE_OTHER): Payer: Self-pay | Admitting: Ophthalmology

## 2020-10-07 DIAGNOSIS — H2513 Age-related nuclear cataract, bilateral: Secondary | ICD-10-CM

## 2020-10-07 DIAGNOSIS — H25043 Posterior subcapsular polar age-related cataract, bilateral: Secondary | ICD-10-CM

## 2020-10-07 DIAGNOSIS — H43822 Vitreomacular adhesion, left eye: Secondary | ICD-10-CM | POA: Diagnosis not present

## 2020-10-07 DIAGNOSIS — H43821 Vitreomacular adhesion, right eye: Secondary | ICD-10-CM | POA: Diagnosis not present

## 2020-10-07 DIAGNOSIS — E119 Type 2 diabetes mellitus without complications: Secondary | ICD-10-CM

## 2020-10-07 NOTE — Assessment & Plan Note (Signed)

## 2020-10-07 NOTE — Progress Notes (Signed)
10/07/2020     CHIEF COMPLAINT Patient presents for Retina Follow Up   HISTORY OF PRESENT ILLNESS: Megan Gomez is a 59 y.o. female who presents to the clinic today for:   HPI    Retina Follow Up    Patient presents with  Diabetic Retinopathy.  In both eyes.  This started 1 year ago.  Severity is mild.  Duration of 1 year.  Since onset it is stable.          Comments    1 Year Diabetic F/U OU  Pt denies noticeable changes to [place] OU since last visit. Pt denies ocular pain, flashes of light, or floaters OU.  A1c: 7.1, 08/2020 LBS: 119 yesterday AM       Last edited by Rockie Neighbours, [place] on 10/07/2020 10:19 AM. (History)      Referring physician: Alycia Rossetti, MD [id number],  [place] [address]  HISTORICAL INFORMATION:   Selected notes from the MEDICAL RECORD NUMBER    Lab Results  Component Value Date   HGBA1C 7.0 (H) 08/26/2020     CURRENT MEDICATIONS: No current outpatient medications on file. (Ophthalmic Drugs)   No current facility-administered medications for this visit. (Ophthalmic Drugs)   Current Outpatient Medications (Other)  Medication Sig  . atorvastatin (LIPITOR) 40 MG tablet Take one tablet by mouth at night for cholesterol  . escitalopram (LEXAPRO) 5 MG tablet TAKE 1 TABLET(5 MG) BY MOUTH DAILY  . hydrochlorothiazide (HYDRODIURIL) 12.5 MG tablet Take 1 tablet (12.5 mg total) by mouth daily.  Marland Kitchen losartan (COZAAR) 100 MG tablet Take one tablet daily for blood pressure  . metFORMIN (GLUCOPHAGE) 500 MG tablet Take 1 tablet (500 mg total) by mouth 2 (two) times daily with a meal.  . metoprolol tartrate (LOPRESSOR) 25 MG tablet Take one a day for 7 days then stop   No current facility-administered medications for this visit. (Other)      REVIEW OF SYSTEMS:    ALLERGIES Allergies  Allergen Reactions  . Cephalexin Swelling  . Chlorhexidine Gluconate Itching  . Demerol Nausea And Vomiting  . Stadol [Butorphanol  Tartrate] Nausea And Vomiting    PAST MEDICAL HISTORY Past Medical History:  Diagnosis Date  . Diabetes mellitus   . Hyperlipidemia   . Hypertension   . Rotator cuff tear    left  . 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 Vitreomacular adhesion of right eye 10/06/2020   Past Surgical History:  Procedure Laterality Date  . BREAST EXCISIONAL BIOPSY Left   . BREATH TEK H PYLORI  04/04/2012   Procedure: BREATH TEK H PYLORI;  Surgeon: Shann Medal, MD;  Location: [facility];  Service: General;  Laterality: N/A;  . CESAREAN SECTION  2001, 2005  . EYE SURGERY  1995   Lasik  . INNER EAR SURGERY  1998  . LAPAROSCOPIC GASTRIC BANDING  08/28/2012   Procedure: LAPAROSCOPIC GASTRIC BANDING;  Surgeon: Shann Medal, MD;  Location: [facility];  Service: General;  Laterality: N/A;    FAMILY HISTORY Family History  Problem Relation Age of Onset  . Diabetes Mother   . Stroke Father   . Cancer Sister        breast cancer   . Diabetes Sister   . Breast cancer Sister   . Diabetes Brother     SOCIAL HISTORY Social History   Tobacco Use  . Smoking status: Former Smoker    Packs/day: 1.00  Years: 10.00    Pack years: 10.00    Types: Cigarettes    Quit date: 11/29/1999    Years since quitting: 20.8  . Smokeless tobacco: Never Used  Vaping Use  . Vaping Use: Never used  Substance Use Topics  . Alcohol use: Yes    Comment: socially  . Drug use: No         OPHTHALMIC EXAM:  Base Eye Exam    Visual Acuity (ETDRS)      Right Left   Dist cc 20/25 +2 20/25 +2   Correction: Glasses       Tonometry (Tonopen, 10:19 AM)      Right Left   Pressure 14 14       Pupils      Pupils Dark Light Shape React APD   Right PERRL 5 4 Round Brisk None   Left PERRL 5 4 Round Brisk None       Visual Fields (Counting fingers)      Left Right    Full Full       Extraocular Movement      Right Left    Full Full       Neuro/Psych    Oriented x3: Yes   Mood/Affect: Normal        Dilation    Both eyes: 1.0% Mydriacyl, 2.5% Phenylephrine @ 10:22 AM        Slit Lamp and Fundus Exam    External Exam      Right Left   External Normal Normal       Slit Lamp Exam      Right Left   Lids/Lashes Normal Normal   Conjunctiva/Sclera White and quiet White and quiet   Cornea Clear Clear   Anterior Chamber Deep and quiet Deep and quiet   Iris Round and reactive Round and reactive   Lens 1+ Nuclear sclerosis, 2+ Posterior subcapsular cataract, posterior polar cataract changes 1+ Nuclear sclerosis, 2+ Posterior subcapsular cataract, posterior polar cataract changes   Anterior Vitreous Normal Normal       Fundus Exam      Right Left   Posterior Vitreous Normal Normal   Disc Normal Normal   Macula Normal Normal   Vessels no DR no DR   Periphery Normal Normal          IMAGING AND PROCEDURES  Imaging and Procedures for 10/07/20  OCT, Retina - OU - Both Eyes       Right Eye Quality was good. Scan locations included subfoveal. Central Foveal Thickness: 255. Progression has been stable. Findings include normal foveal contour.   Left Eye Quality was good. Scan locations included subfoveal. Central Foveal Thickness: 259. Progression has been stable. Findings include normal foveal contour.   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  Nonetheless not visually significant  I explained the patient she may de need to have a very experience cataract surgeon because of the higher technical complication rate at the time of surgery with posterior polar cataracts  Diabetes mellitus type II, controlled ([place]) The patient has diabetes without any evidence of retinopathy. The patient advised to maintain good blood  glucose control, excellent blood pressure control, and favorable  levels of cholesterol, low density lipoprotein, and high density lipoproteins. Follow up in 1 year was recommended. Explained that fluctuations in visual acuity , or "out of focus", may result from large variations of blood sugar control.      ICD-10-CM   1. Vitreomacular adhesion of left eye  H43.822 OCT, Retina - OU - Both Eyes  2. Nuclear sclerotic cataract of both eyes  H25.13   3. 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 Vitreomacular adhesion of right eye  H43.821 OCT, Retina - OU - Both Eyes  5. Controlled type 2 diabetes mellitus without complication, without long-term current use of insulin (HCC)  E11.9     1.  2.  3.  Ophthalmic Meds Ordered this visit:  No orders of the defined types were placed in this encounter.      Return in about 1 year (around 10/07/2021) for DILATE OU, OCT.  There are no Patient Instructions on file for this visit.   Explained the diagnoses, plan, and follow up with the patient and they expressed understanding.  Patient expressed understanding of the importance of proper follow up care.   Clent Demark Joedy Eickhoff M.D. Diseases & Surgery of the Retina and Vitreous [facility] [place] 10/07/20     Abbreviations: M myopia (nearsighted); A astigmatism; H hyperopia (farsighted); P presbyopia; Mrx spectacle prescription;  CTL contact lenses; OD right eye; OS left eye; OU both eyes  XT exotropia; ET esotropia; PEK punctate epithelial keratitis; PEE punctate epithelial erosions; DES dry eye syndrome; MGD meibomian gland dysfunction; ATs artificial tears; PFAT's preservative free artificial tears; [place] nuclear sclerotic cataract; PSC posterior subcapsular cataract; ERM epi-retinal membrane; PVD posterior vitreous detachment; RD retinal detachment; DM diabetes mellitus; DR diabetic retinopathy; NPDR non-proliferative diabetic retinopathy; PDR proliferative  diabetic retinopathy; CSME clinically significant macular edema; DME diabetic macular edema; dbh dot blot hemorrhages; CWS cotton wool spot; POAG primary open angle glaucoma; C/D cup-to-disc ratio; HVF humphrey visual field; GVF goldmann visual field; OCT optical coherence tomography; IOP intraocular pressure; BRVO Branch retinal vein occlusion; CRVO central retinal vein occlusion; CRAO central retinal artery occlusion; BRAO branch retinal artery occlusion; RT retinal tear; SB scleral buckle; PPV pars plana vitrectomy; VH Vitreous hemorrhage; PRP panretinal laser photocoagulation; IVK intravitreal kenalog; VMT vitreomacular traction; MH Macular hole;  NVD neovascularization of the disc; NVE neovascularization elsewhere; AREDS age related eye disease study; ARMD age related macular degeneration; POAG primary open angle glaucoma; EBMD epithelial/anterior basement membrane dystrophy; ACIOL anterior chamber intraocular lens; IOL intraocular lens; PCIOL posterior chamber intraocular lens; Phaco/IOL phacoemulsification with intraocular lens placement; [place] photorefractive keratectomy; LASIK laser assisted in situ keratomileusis; HTN hypertension; DM diabetes mellitus; COPD chronic obstructive pulmonary disease

## 2020-10-07 NOTE — Assessment & Plan Note (Signed)
Overall these findings in each eye are stable, some subtle PSC changes may be more pronounced in the right eye as compared to prior years.  Nonetheless not visually significant  I explained the patient she may de need to have a very experience cataract surgeon because of the higher technical complication rate at the time of surgery with posterior polar cataracts

## 2021-02-01 ENCOUNTER — Ambulatory Visit: Payer: Federal, State, Local not specified - PPO | Admitting: Family Medicine

## 2021-02-19 ENCOUNTER — Other Ambulatory Visit: Payer: Self-pay | Admitting: Family Medicine

## 2021-02-22 ENCOUNTER — Encounter: Payer: Self-pay | Admitting: Family Medicine

## 2021-02-22 ENCOUNTER — Other Ambulatory Visit: Payer: Self-pay

## 2021-02-22 ENCOUNTER — Ambulatory Visit: Payer: Federal, State, Local not specified - PPO | Admitting: Family Medicine

## 2021-02-22 ENCOUNTER — Other Ambulatory Visit: Payer: Self-pay | Admitting: Family Medicine

## 2021-02-22 VITALS — BP 162/90 | HR 92 | Temp 98.4°F | Ht 63.0 in | Wt 172.2 lb

## 2021-02-22 DIAGNOSIS — E119 Type 2 diabetes mellitus without complications: Secondary | ICD-10-CM | POA: Diagnosis not present

## 2021-02-22 DIAGNOSIS — I10 Essential (primary) hypertension: Secondary | ICD-10-CM | POA: Diagnosis not present

## 2021-02-22 DIAGNOSIS — E782 Mixed hyperlipidemia: Secondary | ICD-10-CM | POA: Diagnosis not present

## 2021-02-22 DIAGNOSIS — Z23 Encounter for immunization: Secondary | ICD-10-CM

## 2021-02-22 LAB — BASIC METABOLIC PANEL
BUN: 21 mg/dL (ref 6–23)
CO2: 27 mEq/L (ref 19–32)
Calcium: 9.2 mg/dL (ref 8.4–10.5)
Chloride: 101 mEq/L (ref 96–112)
Creatinine, Ser: 0.66 mg/dL (ref 0.40–1.20)
GFR: 95.23 mL/min (ref >60.00)
Glucose, Bld: 148 mg/dL — ABNORMAL HIGH (ref 70–99)
Potassium: 3.5 mEq/L (ref 3.5–5.1)
Sodium: 137 mEq/L (ref 135–145)

## 2021-02-22 LAB — LIPID PANEL
Cholesterol: 197 mg/dL (ref 0–200)
HDL: 55.6 mg/dL (ref >39.00)
LDL Cholesterol: 115 mg/dL — ABNORMAL HIGH (ref 0–99)
NonHDL: 141.81
Total CHOL/HDL Ratio: 4
Triglycerides: 132 mg/dL (ref 0.0–149.0)
VLDL: 26.4 mg/dL (ref 0.0–40.0)

## 2021-02-22 LAB — HEMOGLOBIN A1C: Hgb A1c MFr Bld: 8.2 % — ABNORMAL HIGH (ref 4.6–6.5)

## 2021-02-22 LAB — HEPATIC FUNCTION PANEL
ALT: 13 U/L (ref 0–35)
AST: 17 U/L (ref 0–37)
Albumin: 4 g/dL (ref 3.5–5.2)
Alkaline Phosphatase: 84 U/L (ref 39–117)
Bilirubin, Direct: 0.1 mg/dL (ref 0.0–0.3)
Total Bilirubin: 0.5 mg/dL (ref 0.2–1.2)
Total Protein: 7.2 g/dL (ref 6.0–8.3)

## 2021-02-22 MED ORDER — HYDROCHLOROTHIAZIDE 25 MG PO TABS: 25.0000 mg | ORAL_TABLET | Freq: Every day | ORAL | 3 refills | Status: DC

## 2021-02-22 MED ORDER — METFORMIN HCL 1000 MG PO TABS: 1000.0000 mg | ORAL_TABLET | Freq: Two times a day (BID) | ORAL | 3 refills | Status: DC

## 2021-02-22 NOTE — Patient Instructions (Addendum)
Blood pressure still above goal! im going to increase your hctz to 25mg /day. I sent in a new px for this. You can double up on your pills at home   Checking labs and will let you know how your diabetes is doing and cholesterol.  Keep up the good work and working out! Proud of you!  See you in one month for bp check   Dr. Rogers Blocker

## 2021-02-22 NOTE — Progress Notes (Signed)
Patient: Megan Gomez MRN: [id number] DOB: 1961-05-13 PCP: Orma Flaming, MD     Subjective:  Chief Complaint  Patient presents with  . Diabetes  . Hypertension    HPI: The patient is a 60 y.o. female who presents today for diabetes and htn follow up.    Hypertension: Here for follow up of hypertension. Currently on losaratn 100mg  daily and I weaned her off of the lopressor.  I also started her on hctz 12.5mg  at her last visit. Marland Kitchen Her home readings are reading 140/80.  takes medication as prescribed and denies any side effects. Exercise includeswalking. Weight has beenincreasing. Denies any chest pain, headaches, shortness of breath, vision changes, swelling in lower extremities.  Diabetes Her a1c was 7.0. she was off her metformin and we re started this back at her appointment. Taking as prescribed. she is taking her metformin 500mg  twice a day. Her diet is okay. She has started to drink milk and coffee. Her weight has gone up.  Hyperlipidemia Her cholesterol was elevated at her last visit in September.  She was off her medication at that time. She has been taking her medication nightly since I sent this in. She is also exercising more as well.   Review of Systems  Constitutional: Negative for chills, fatigue and fever.  HENT: Negative for dental problem, ear pain, hearing loss and trouble swallowing.   Eyes: Negative for visual disturbance.  Respiratory: Negative for cough, chest tightness and shortness of breath.   Cardiovascular: Negative for chest pain, palpitations and leg swelling.  Gastrointestinal: Negative for abdominal pain, blood in stool, diarrhea and nausea.  Endocrine: Negative for cold intolerance, polydipsia, polyphagia and polyuria.  Genitourinary: Negative for dysuria and hematuria.  Musculoskeletal: Negative for arthralgias.  Skin: Negative for rash.  Neurological: Negative for dizziness and headaches.  Psychiatric/Behavioral: Negative for dysphoric  mood and sleep disturbance. The patient is not nervous/anxious.     Allergies Patient is allergic to cephalexin, chlorhexidine gluconate, demerol, and stadol [butorphanol tartrate].  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  Surgical History Patient  has a past surgical history that includes Cesarean section (2001, 2005); Inner ear surgery (1998); Breath tek h pylori (04/04/2012); Eye surgery (1995); Laparoscopic gastric banding (08/28/2012); and Breast excisional biopsy (Left).  Family History Pateint's family history includes Breast cancer in her sister; Cancer in her sister; Diabetes in her brother, mother, and sister; Stroke in her father.  Social History Patient  reports that she quit smoking about 21 years ago. Her smoking use included cigarettes. She has a 10.00 pack-year smoking history. She has never used smokeless tobacco. She reports current alcohol use. She reports that she does not use drugs.    Objective: Vitals:   02/22/21 1011  BP: (!) 162/90  Pulse: 92  Temp: 98.4 F (36.9 C)  TempSrc: Temporal  SpO2: 97%  Weight: 172 lb 3.2 oz (78.1 kg)  Height: 5\' 3"  (1.6 m)    Body mass index is 30.5 kg/m.  Physical Exam Vitals reviewed.  Constitutional:      Appearance: Normal appearance. She is well-developed. She is obese.  HENT:     Head: Normocephalic and atraumatic.     Right Ear: Tympanic membrane, ear canal and external ear normal.     Left Ear: Tympanic membrane, ear canal and external ear normal.  Eyes:     Conjunctiva/sclera:  Conjunctivae normal.     Pupils: Pupils are equal, round, and reactive to light.  Neck:     Thyroid: No thyromegaly.     Vascular: No carotid bruit.  Cardiovascular:     Rate and Rhythm: Normal rate and regular rhythm.     Pulses: Normal pulses.      Heart sounds: Normal heart sounds. No murmur heard.   Pulmonary:     Effort: Pulmonary effort is normal.     Breath sounds: Normal breath sounds.  Abdominal:     General: Bowel sounds are normal. There is no distension.     Palpations: Abdomen is soft.     Tenderness: There is no abdominal tenderness.  Musculoskeletal:     Cervical back: Normal range of motion and neck supple.  Lymphadenopathy:     Cervical: No cervical adenopathy.  Skin:    General: Skin is warm and dry.     Capillary Refill: Capillary refill takes less than 2 seconds.     Findings: No rash.  Neurological:     General: No focal deficit present.     Mental Status: She is alert and oriented to person, place, and time.     Cranial Nerves: No cranial nerve deficit.     Coordination: Coordination normal.     Deep Tendon Reflexes: Reflexes normal.  Psychiatric:        Mood and Affect: Mood normal.        Behavior: Behavior normal.        Assessment/plan: 1. Mixed hyperlipidemia Started back her statin, checking labs today and hopefully to goal.  - Lipid panel  2. Controlled type 2 diabetes mellitus without complication, without long-term current use of insulin (HCC) -a1c today after increasing her metformin -on ARB/statin -will f/u with a1c and see if we need to adjust medication.  - Basic metabolic panel - Hemoglobin A1c  3. Primary hypertension Above goal today. We are going to increase her hctz to 25mg /day and continue her losartan 100mg /day. Work on low sodium and is already doing a great job working out. She will keep a log for me as well. (wrote px for her). F/u in one month with log.   4. Need for Tdap vaccination  - Tdap vaccine greater than or equal to 61yo IM    This visit occurred during the SARS-CoV-2 public health emergency.  Safety protocols were in place, including screening questions prior to the visit, additional usage of staff PPE, and extensive cleaning of exam room while observing  appropriate contact time as indicated for disinfecting solutions.     Return in about 1 month (around 03/25/2021) for bp check .   Orma Flaming, MD [place]   02/22/2021

## 2021-04-14 DIAGNOSIS — Z4651 Encounter for fitting and adjustment of gastric lap band: Secondary | ICD-10-CM | POA: Diagnosis not present

## 2021-04-21 DIAGNOSIS — Z1231 Encounter for screening mammogram for malignant neoplasm of breast: Secondary | ICD-10-CM | POA: Diagnosis not present

## 2021-05-20 ENCOUNTER — Other Ambulatory Visit: Payer: Self-pay | Admitting: Family Medicine

## 2021-06-30 ENCOUNTER — Telehealth: Payer: Self-pay

## 2021-06-30 ENCOUNTER — Other Ambulatory Visit: Payer: Self-pay

## 2021-06-30 MED ORDER — LOSARTAN POTASSIUM 100 MG PO TABS: ORAL_TABLET | ORAL | 1 refills | Status: DC

## 2021-06-30 MED ORDER — ESCITALOPRAM OXALATE 5 MG PO TABS: ORAL_TABLET | ORAL | 0 refills | Status: DC

## 2021-06-30 NOTE — Telephone Encounter (Signed)
Rx sent in

## 2021-06-30 NOTE — Telephone Encounter (Signed)
.   Encourage patient to contact the pharmacy for refills or they can request refills through [facility]:  Please schedule appointment if longer than 1 year  NEXT APPOINTMENT DATE:08/16/2021  MEDICATION:losartan (COZAAR) 100 MG tablet escitalopram (LEXAPRO) 5 MG tablet  Is the patient out of medication? About 4 days left   PHARMACY:[facility] [place], [place] - [address] AT [place]  90 day supply please

## 2021-07-07 DIAGNOSIS — M5116 Intervertebral disc disorders with radiculopathy, lumbar region: Secondary | ICD-10-CM | POA: Diagnosis not present

## 2021-07-07 DIAGNOSIS — M419 Scoliosis, unspecified: Secondary | ICD-10-CM | POA: Diagnosis not present

## 2021-08-07 ENCOUNTER — Other Ambulatory Visit: Payer: Self-pay | Admitting: Family Medicine

## 2021-08-16 ENCOUNTER — Encounter: Payer: Federal, State, Local not specified - PPO | Admitting: Physician Assistant

## 2021-10-06 ENCOUNTER — Other Ambulatory Visit: Payer: Self-pay

## 2021-10-06 ENCOUNTER — Encounter (INDEPENDENT_AMBULATORY_CARE_PROVIDER_SITE_OTHER): Payer: Self-pay | Admitting: Ophthalmology

## 2021-10-06 ENCOUNTER — Ambulatory Visit (INDEPENDENT_AMBULATORY_CARE_PROVIDER_SITE_OTHER): Payer: Federal, State, Local not specified - PPO | Admitting: Ophthalmology

## 2021-10-06 DIAGNOSIS — E119 Type 2 diabetes mellitus without complications: Secondary | ICD-10-CM | POA: Diagnosis not present

## 2021-10-06 DIAGNOSIS — H2513 Age-related nuclear cataract, bilateral: Secondary | ICD-10-CM

## 2021-10-06 DIAGNOSIS — H43822 Vitreomacular adhesion, left eye: Secondary | ICD-10-CM | POA: Diagnosis not present

## 2021-10-06 DIAGNOSIS — H43823 Vitreomacular adhesion, bilateral: Secondary | ICD-10-CM | POA: Diagnosis not present

## 2021-10-06 DIAGNOSIS — H25043 Posterior subcapsular polar age-related cataract, bilateral: Secondary | ICD-10-CM

## 2021-10-06 NOTE — Assessment & Plan Note (Signed)
Posterior polar cataracts, no interval change, slightly worse OD

## 2021-10-06 NOTE — Assessment & Plan Note (Signed)
No detectable diabetic retinopathy 

## 2021-10-06 NOTE — Assessment & Plan Note (Signed)
Mild cataract OU stable

## 2021-10-06 NOTE — Progress Notes (Signed)
10/06/2021     CHIEF COMPLAINT Patient presents for  Chief Complaint  Patient presents with   Retina Follow Up      HISTORY OF PRESENT ILLNESS: Megan Gomez is a 60 y.o. female who presents to the clinic today for:   HPI     Retina Follow Up   Patient presents with  Other.  In both eyes.  This started 1 year ago.  Duration of 1 year.  Since onset it is stable.        Comments   1 year f/u OU with OCT      Last edited by Reather Littler, COA on 10/06/2021 11:03 AM.      Referring physician: Orma Flaming, MD [place],  [place] [id number]  HISTORICAL INFORMATION:   Selected notes from the MEDICAL RECORD NUMBER    Lab Results  Component Value Date   HGBA1C 8.2 (H) 02/22/2021     CURRENT MEDICATIONS: No current outpatient medications on file. (Ophthalmic Drugs)   No current facility-administered medications for this visit. (Ophthalmic Drugs)   Current Outpatient Medications (Other)  Medication Sig   atorvastatin (LIPITOR) 40 MG tablet TAKE 1 TABLET BY MOUTH AT NIGHT FOR CHOLESTEROL   B Complex Vitamins (B COMPLEX 1 PO) Place under the tongue.   cholecalciferol (VITAMIN D3) 25 MCG (1000 UNIT) tablet Take 1,000 Units by mouth daily.   escitalopram (LEXAPRO) 5 MG tablet TAKE 1 TABLET(5 MG) BY MOUTH DAILY.   hydrochlorothiazide (HYDRODIURIL) 25 MG tablet Take 1 tablet (25 mg total) by mouth daily.   losartan (COZAAR) 100 MG tablet TAKE 1 TABLET BY MOUTH DAILY FOR BLOOD PRESSURE   metFORMIN (GLUCOPHAGE) 1000 MG tablet Take 1 tablet (1,000 mg total) by mouth 2 (two) times daily with a meal.   metoprolol tartrate (LOPRESSOR) 25 MG tablet Take one a day for 7 days then stop (Patient not taking: Reported on 02/22/2021)   Multiple Vitamin (MULTIVITAMIN) tablet Take 1 tablet by mouth daily.   No current facility-administered medications for this visit. (Other)      REVIEW OF SYSTEMS:    ALLERGIES Allergies  Allergen Reactions   Cephalexin  Swelling   Chlorhexidine Gluconate Itching   Demerol Nausea And Vomiting   Stadol [Butorphanol Tartrate] Nausea And Vomiting    PAST MEDICAL HISTORY Past Medical History:  Diagnosis Date   Diabetes mellitus    Hyperlipidemia    Hypertension    Rotator cuff tear    left   Vitreomacular adhesion of left eye 10/06/2020   Vitreomacular adhesion of right eye 10/06/2020   Past Surgical History:  Procedure Laterality Date   BREAST EXCISIONAL BIOPSY Left    BREATH TEK H PYLORI  04/04/2012   Procedure: BREATH TEK H PYLORI;  Surgeon: Shann Medal, MD;  Location: [facility];  Service: General;  Laterality: N/A;   CESAREAN SECTION  2001, 2005   [place] GASTRIC BANDING  08/28/2012   Procedure: LAPAROSCOPIC GASTRIC BANDING;  Surgeon: Shann Medal, MD;  Location: [facility];  Service: General;  Laterality: N/A;    FAMILY HISTORY Family History  Problem Relation Age of Onset   Diabetes Mother    Stroke Father    Cancer Sister        breast cancer    Diabetes Sister    Breast cancer Sister    Diabetes Brother     SOCIAL  HISTORY Social History   Tobacco Use   Smoking status: Former    Packs/day: 1.00    Years: 10.00    Pack years: 10.00    Types: Cigarettes    Quit date: 11/29/1999    Years since quitting: 21.8   Smokeless tobacco: Never  Vaping Use   Vaping Use: Never used  Substance Use Topics   Alcohol use: Yes    Comment: socially   Drug use: No         OPHTHALMIC EXAM:  Base Eye Exam     Visual Acuity (ETDRS)       Right Left   Dist cc 20/20 20/20    Correction: Glasses         Tonometry (Tonopen, 11:11 AM)       Right Left   Pressure 12 11         Pupils       Pupils Dark Light Shape React APD   Right PERRL 5 4 Round Brisk None   Left PERRL 5 4 Round Brisk None         Visual Fields (Counting fingers)       Left Right    Full Full         Extraocular Movement       Right  Left    Full, Ortho Full, Ortho         Neuro/Psych     Oriented x3: Yes   Mood/Affect: Normal         Dilation     Both eyes: 1.0% Mydriacyl, 2.5% Phenylephrine @ 11:11 AM           Slit Lamp and Fundus Exam     External Exam       Right Left   External Normal Normal         Slit Lamp Exam       Right Left   Lids/Lashes Normal Normal   Conjunctiva/Sclera White and quiet White and quiet   Cornea Clear Clear   Anterior Chamber Deep and quiet Deep and quiet   Iris Round and reactive Round and reactive   Lens 1+ Nuclear sclerosis, 2+ Posterior subcapsular cataract, posterior polar cataract changes 1+ Nuclear sclerosis, 2+ Posterior subcapsular cataract, posterior polar cataract changes   Anterior Vitreous Normal Normal         Fundus Exam       Right Left   Posterior Vitreous Normal Normal   Disc Normal Oblique insertion of nerve, inf, mild peripapillary atrophy   C/D Ratio 0.65 0.45   Macula Normal Normal   Vessels no DR no DR   Periphery Normal Normal            IMAGING AND PROCEDURES  Imaging and Procedures for 10/06/21  OCT, Retina - OU - Both Eyes       Right Eye Quality was good. Scan locations included subfoveal. Central Foveal Thickness: 253. Progression has been stable. Findings include normal foveal contour, vitreomacular adhesion .   Left Eye Quality was good. Scan locations included subfoveal. Central Foveal Thickness: 254. Progression has been stable. Findings include normal foveal contour, vitreomacular adhesion .              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 Vitreomacular adhesion of left eye  H43.822 OCT, Retina - OU - Both Eyes    2. Vitreomacular adhesion of both eyes  H43.823     3. Controlled type 2 diabetes mellitus without complication, without long-term current use of insulin (HCC)  E11.9     4. 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 Nuclear sclerotic cataract of both eyes  H25.13       1.  OU physiologically no detectable diabetic retinopathy  2.  OU bilateral cataracts with posterior polar cataracts.  I spent some time explaining to the patient that these types of cataracts while easily removed and corrected to prevent significant symptoms, they tend to have a slightly higher complication rate with routine cataract surgery.  I have explained this to her should she 1-Day see cataract evaluation she needs to see a high-volume highly skilled cataract surgery with intraocular lens capability and eyes with posterior polar cataract  3.  Ophthalmic Meds Ordered this visit:  No orders of the defined types were placed in this encounter.      Return in about 1 year (around 10/06/2022) for COLOR FP, OCT.  There are no Patient Instructions on file for this visit.   Explained the diagnoses, plan, and follow up with the patient and they expressed understanding.  Patient expressed understanding of the importance of proper follow up care.   Clent Demark Handy Mcloud M.D. Diseases & Surgery of the Retina and Vitreous Retina [facility] Diabetic [place] 10/06/21     Abbreviations: M myopia (nearsighted); A astigmatism; H hyperopia (farsighted); P presbyopia; Mrx spectacle prescription;  CTL contact lenses; OD right eye; OS left eye; OU both eyes  XT exotropia; ET esotropia; PEK punctate epithelial keratitis; PEE punctate epithelial erosions; DES dry eye syndrome; MGD meibomian gland dysfunction; ATs artificial tears; PFAT's preservative free artificial tears; [place]  nuclear sclerotic cataract; PSC posterior subcapsular cataract; ERM epi-retinal membrane; PVD posterior vitreous detachment; RD retinal detachment; DM diabetes mellitus; DR diabetic retinopathy; NPDR non-proliferative diabetic retinopathy; PDR proliferative diabetic retinopathy; CSME clinically significant macular edema; DME diabetic macular edema; dbh dot blot hemorrhages; CWS cotton wool spot; POAG primary open angle glaucoma; C/D cup-to-disc ratio; HVF humphrey visual field; GVF goldmann visual field; OCT optical coherence tomography; IOP intraocular pressure; BRVO Branch retinal vein occlusion; CRVO central retinal vein occlusion; CRAO central retinal artery occlusion; BRAO branch retinal artery occlusion; RT retinal tear; SB scleral buckle; PPV pars plana vitrectomy; VH Vitreous hemorrhage; PRP panretinal laser photocoagulation; IVK intravitreal kenalog; VMT vitreomacular traction; MH Macular hole;  NVD neovascularization of the disc; NVE neovascularization elsewhere; AREDS age related eye disease study; ARMD age related macular degeneration; POAG primary open angle glaucoma; EBMD epithelial/anterior basement membrane dystrophy; ACIOL anterior chamber intraocular lens; IOL intraocular lens; PCIOL posterior chamber intraocular lens; Phaco/IOL phacoemulsification with intraocular lens placement; Friend photorefractive keratectomy; LASIK laser assisted in situ keratomileusis; HTN hypertension; DM diabetes mellitus; COPD chronic obstructive pulmonary disease

## 2021-10-06 NOTE — Assessment & Plan Note (Signed)
Physiologic, no pathology

## 2021-10-07 ENCOUNTER — Encounter (INDEPENDENT_AMBULATORY_CARE_PROVIDER_SITE_OTHER): Payer: Federal, State, Local not specified - PPO | Admitting: Ophthalmology

## 2021-10-11 ENCOUNTER — Encounter: Payer: Federal, State, Local not specified - PPO | Admitting: Family

## 2021-10-11 ENCOUNTER — Encounter: Payer: Federal, State, Local not specified - PPO | Admitting: Physician Assistant

## 2021-10-21 ENCOUNTER — Other Ambulatory Visit: Payer: Self-pay | Admitting: Physician Assistant

## 2021-11-08 ENCOUNTER — Other Ambulatory Visit: Payer: Self-pay | Admitting: Physician Assistant

## 2021-11-08 ENCOUNTER — Ambulatory Visit: Payer: Federal, State, Local not specified - PPO | Admitting: Family

## 2021-11-08 ENCOUNTER — Telehealth: Payer: Self-pay

## 2021-11-08 ENCOUNTER — Other Ambulatory Visit: Payer: Self-pay

## 2021-11-08 ENCOUNTER — Encounter: Payer: Self-pay | Admitting: Family

## 2021-11-08 VITALS — BP 130/90 | HR 89 | Temp 98.6°F | Ht 63.0 in | Wt 156.6 lb

## 2021-11-08 DIAGNOSIS — E118 Type 2 diabetes mellitus with unspecified complications: Secondary | ICD-10-CM | POA: Diagnosis not present

## 2021-11-08 DIAGNOSIS — E1169 Type 2 diabetes mellitus with other specified complication: Secondary | ICD-10-CM | POA: Insufficient documentation

## 2021-11-08 DIAGNOSIS — I10 Essential (primary) hypertension: Secondary | ICD-10-CM

## 2021-11-08 DIAGNOSIS — E782 Mixed hyperlipidemia: Secondary | ICD-10-CM | POA: Diagnosis not present

## 2021-11-08 MED ORDER — ESCITALOPRAM OXALATE 5 MG PO TABS: ORAL_TABLET | ORAL | 0 refills | Status: DC

## 2021-11-08 NOTE — Progress Notes (Signed)
Subjective:     Patient ID: Megan Gomez, female    DOB: [birth year], 60 y.o.   MRN: [id number]  Chief Complaint  Patient presents with   Transitions Of Care   Hypertension    Was asked to keep a BP log last visit. She does not have it. She also is not taking HCTZ.   Hyperlipidemia    HPI: Hypertension: Patient is currently maintained on the following medications for blood pressure: None Patient reports good compliance with blood pressure medications. Patient denies chest pain, shortness of breath or swelling. Last 3 blood pressure readings in our office are as follows: BP Readings from Last 3 Encounters:  11/08/21 130/90  02/22/21 (!) 162/90  09/28/20 (!) 144/80  Hyperlipidemia: Patient is currently maintained on the following medication for hyperlipidemia: Atorvastatin. Patient denies myalgia or other side effects. Patient reports good compliance with low fat/low cholesterol diet.  Last lipid panel as follows: Lab Results  Component Value Date   CHOL 197 02/22/2021   HDL 55.60 02/22/2021   LDLCALC 115 (H) 02/22/2021   TRIG 132.0 02/22/2021   CHOLHDL 4 02/22/2021  The 10-year ASCVD risk score (Arnett DK, et al., 2019) is: 9.4%  T2DM: Pt is currently maintained on the following medications for diabetes:Metformin Failed meds include: Jardiance, Metformin bid, higher dose, increased last visit, but pt could not tolerate. Pt has lost 16lbs. Denies polyuria/polydipsia. Denies hypoglycemia Home glucose readings range: 124 Last A1C was  Lab Results  Component Value Date   HGBA1C 8.2 (H) 02/22/2021   HGBA1C 7.0 (H) 08/26/2020   HGBA1C 7.2 (H) 04/03/2018      Health Maintenance Due  Topic Date Due   FOOT EXAM  Never done   OPHTHALMOLOGY EXAM  Never done   Pneumococcal Vaccine 59-70 Years old (2 - PCV) 12/22/2011   MAMMOGRAM  03/29/2020   PAP SMEAR-Modifier  04/03/2021   INFLUENZA VACCINE  06/28/2021   HEMOGLOBIN A1C  08/25/2021    Past Medical History:   Diagnosis Date   Diabetes mellitus    Hyperlipidemia    Hypertension    Rotator cuff tear    left   Vitreomacular adhesion of left eye 10/06/2020   Vitreomacular adhesion of right eye 10/06/2020    Past Surgical History:  Procedure Laterality Date   BREAST EXCISIONAL BIOPSY Left    BREATH TEK H PYLORI  04/04/2012   Procedure: BREATH TEK H PYLORI;  Surgeon: Shann Medal, MD;  Location: [facility];  Service: General;  Laterality: N/A;   CESAREAN SECTION  2001, 2005   [place]  08/28/2012   Procedure: LAPAROSCOPIC GASTRIC BANDING;  Surgeon: Shann Medal, MD;  Location: [facility];  Service: General;  Laterality: N/A;    Outpatient Medications Prior to Visit  Medication Sig Dispense Refill   atorvastatin (LIPITOR) 40 MG tablet TAKE 1 TABLET BY MOUTH AT NIGHT FOR CHOLESTEROL 90 tablet 1   B Complex Vitamins (B COMPLEX 1 PO) Place under the tongue.     cholecalciferol (VITAMIN D3) 25 MCG (1000 UNIT) tablet Take 1,000 Units by mouth daily.     losartan (COZAAR) 100 MG tablet TAKE 1 TABLET BY MOUTH DAILY FOR BLOOD PRESSURE 90 tablet 1   Multiple Vitamin (MULTIVITAMIN) tablet Take 1 tablet by mouth daily.     escitalopram (LEXAPRO) 5 MG tablet TAKE 1 TABLET(5 MG) BY MOUTH DAILY. 90 tablet 0   metoprolol  tartrate (LOPRESSOR) 25 MG tablet Take one a day for 7 days then stop 7 tablet 1   hydrochlorothiazide (HYDRODIURIL) 25 MG tablet Take 1 tablet (25 mg total) by mouth daily. (Patient not taking: Reported on 11/08/2021) 90 tablet 3   metFORMIN (GLUCOPHAGE) 1000 MG tablet Take 1 tablet (1,000 mg total) by mouth 2 (two) times daily with a meal. (Patient not taking: Reported on 11/08/2021) 180 tablet 3   No facility-administered medications prior to visit.    Allergies  Allergen Reactions   Cephalexin Swelling   Chlorhexidine Gluconate Itching   Demerol Nausea And Vomiting   Stadol [Butorphanol Tartrate] Nausea And  Vomiting        Objective:    Physical Exam Vitals and nursing note reviewed.  Constitutional:      Appearance: Normal appearance.  Cardiovascular:     Rate and Rhythm: Normal rate and regular rhythm.  Pulmonary:     Effort: Pulmonary effort is normal.     Breath sounds: Normal breath sounds.  Musculoskeletal:        General: Normal range of motion.  Skin:    General: Skin is warm and dry.  Neurological:     Mental Status: She is alert.  Psychiatric:        Mood and Affect: Mood normal.        Behavior: Behavior normal.    BP 130/90   Pulse 89   Temp 98.6 F (37 C) (Temporal)   Ht 5\' 3"  (1.6 m)   Wt 156 lb 9.6 oz (71 kg)   LMP 04/03/2012   SpO2 98%   BMI 27.74 kg/m  Wt Readings from Last 3 Encounters:  11/08/21 156 lb 9.6 oz (71 kg)  02/22/21 172 lb 3.2 oz (78.1 kg)  09/28/20 166 lb 12.8 oz (75.7 kg)       Assessment & Plan:   Problem List Items Addressed This Visit       Cardiovascular and Mediastinum   Hypertension - Primary    Chronic- Did not tolerate HCTZ started last visit, so stopped. BP good today, pt has lost 16lbs. Reports taking Losartan qd. Will continue to monitor.        Endocrine   Controlled type 2 diabetes mellitus with complication, without long-term current use of insulin (HCC)    Chronic - Last A1C 8.2, Metformin was increased to 1g bid, pt could not tolerate, now taking 500mg  qd. Has lost 16lbs since last visit, will recheck A1C today.      Relevant Orders   HgB A1c     Other   Hyperlipidemia    Chronic - taking atorvastatin 40 qd, last LDL a little high, dose stayed the same, rechecking lipids today.      Relevant Orders   Lipid panel

## 2021-11-08 NOTE — Telephone Encounter (Signed)
Sent to pharmacy 

## 2021-11-08 NOTE — Assessment & Plan Note (Addendum)
Chronic - Last A1C 8.2, Metformin was increased to 1g bid, pt could not tolerate, now taking 500mg  qd. Has lost 16lbs since last visit, will recheck A1C today.

## 2021-11-08 NOTE — Telephone Encounter (Signed)
LAST APPOINTMENT DATE: 11/08/21  NEXT APPOINTMENT DATE: none  MEDICATION:escitalopram (LEXAPRO) 5 MG tablet  PHARMACY: [facility] [place], [place] - [address] AT [place]

## 2021-11-08 NOTE — Assessment & Plan Note (Signed)
Chronic - taking atorvastatin 40 qd, last LDL a little high, dose stayed the same, rechecking lipids today.

## 2021-11-08 NOTE — Assessment & Plan Note (Addendum)
Chronic- Did not tolerate HCTZ started last visit, so stopped. BP good today, pt has lost 16lbs. Reports taking Losartan qd. Will continue to monitor.

## 2021-11-08 NOTE — Patient Instructions (Signed)
It was very nice to see you today!  Go to the lab for blood work today. Great job losing weight! Keep up the good work. We will hold off on adding any new blood pressure medications, and we can discuss your diabetic meds after seeing your HgbA1C result.  Continue the Atorvastatin daily.  Have a wonderful holiday!  PLEASE NOTE:  If you had any lab tests please let us know if you have not heard back within a few days. You may see your results on MyChart before we have a chance to review them but we will give you a call once they are reviewed by [place]. If we ordered any referrals today, please let us know if you have not heard from their office within the next week.   Please try these tips to maintain a healthy lifestyle:  Eat most of your calories during the day when you are active. Eliminate processed foods including packaged sweets (pies, cakes, cookies), reduce intake of potatoes, white bread, white pasta, and white rice. Look for whole grain options, oat flour or almond flour.  Each meal should contain half fruits/vegetables, one quarter protein, and one quarter carbs (no bigger than a computer mouse).  Cut down on sweet beverages. This includes juice, soda, and sweet tea. Also watch fruit intake, though this is a healthier sweet option, it still contains natural sugar! Limit to 3 servings daily.  Drink at least 1 glass of water with each meal and aim for at least 8 glasses per day  Exercise at least 150 minutes every week.

## 2021-11-09 LAB — LIPID PANEL
Cholesterol: 198 mg/dL (ref 0–200)
HDL: 61.6 mg/dL (ref >39.00)
NonHDL: 135.9
Total CHOL/HDL Ratio: 3
Triglycerides: 207 mg/dL — ABNORMAL HIGH (ref 0.0–149.0)
VLDL: 41.4 mg/dL — ABNORMAL HIGH (ref 0.0–40.0)

## 2021-11-09 LAB — HEMOGLOBIN A1C: Hgb A1c MFr Bld: 7.4 % — ABNORMAL HIGH (ref 4.6–6.5)

## 2021-11-09 LAB — LDL CHOLESTEROL, DIRECT: Direct LDL: 129 mg/dL

## 2021-11-10 NOTE — Progress Notes (Signed)
Cholesterol numbers are not improved from last time. Be sure you are taking the Lipitor every day and try to cut back on any junk food and fried foods and make therapeutic lifestyle changes to decrease alcohol, nonnutritional snacks e.g. chips/cookies,pies, cakes and candies and increase fruits/vegetables/fiber. Minimize fruit intake (still has sugar!) and avoid fried foods, fatty meat (red meat), high fat dairy foods, including cheese, milk, ice cream.  Cholesterol goals: Total  <200; Triglycerides less than 150; HDL greater than 45 and LDL less than 70.   A1C has improved from 8.2 to 7.4 which is very good, but your goal is less than 6.5. I would advise changing the Metformin to extended release and increasing to twice a day.  Or, as we discussed during the visit to start a new medication Glipizide.  Let me know what you decide! Take care

## 2021-12-06 DIAGNOSIS — M5416 Radiculopathy, lumbar region: Secondary | ICD-10-CM | POA: Diagnosis not present

## 2022-01-04 DIAGNOSIS — M5416 Radiculopathy, lumbar region: Secondary | ICD-10-CM | POA: Diagnosis not present

## 2022-02-01 DIAGNOSIS — M5416 Radiculopathy, lumbar region: Secondary | ICD-10-CM | POA: Diagnosis not present

## 2022-02-14 ENCOUNTER — Other Ambulatory Visit: Payer: Self-pay | Admitting: Family

## 2022-02-14 ENCOUNTER — Other Ambulatory Visit: Payer: Self-pay | Admitting: Physician Assistant

## 2022-02-21 DIAGNOSIS — Z6827 Body mass index (BMI) 27.0-27.9, adult: Secondary | ICD-10-CM | POA: Diagnosis not present

## 2022-02-21 DIAGNOSIS — Z1151 Encounter for screening for human papillomavirus (HPV): Secondary | ICD-10-CM | POA: Diagnosis not present

## 2022-02-21 DIAGNOSIS — Z01419 Encounter for gynecological examination (general) (routine) without abnormal findings: Secondary | ICD-10-CM | POA: Diagnosis not present

## 2022-02-21 DIAGNOSIS — Z124 Encounter for screening for malignant neoplasm of cervix: Secondary | ICD-10-CM | POA: Diagnosis not present

## 2022-02-21 LAB — HM PAP SMEAR

## 2022-03-21 ENCOUNTER — Emergency Department (HOSPITAL_COMMUNITY)
Admission: EM | Admit: 2022-03-21 | Discharge: 2022-03-22 | Disposition: A | Discharge status: Home / Self Care | Payer: Federal, State, Local not specified - PPO | Attending: Emergency Medicine | Admitting: Emergency Medicine

## 2022-03-21 ENCOUNTER — Encounter (HOSPITAL_COMMUNITY): Payer: Self-pay

## 2022-03-21 ENCOUNTER — Other Ambulatory Visit: Payer: Self-pay

## 2022-03-21 ENCOUNTER — Emergency Department (HOSPITAL_COMMUNITY): Payer: Federal, State, Local not specified - PPO

## 2022-03-21 DIAGNOSIS — Z79899 Other long term (current) drug therapy: Secondary | ICD-10-CM | POA: Insufficient documentation

## 2022-03-21 DIAGNOSIS — R110 Nausea: Secondary | ICD-10-CM | POA: Diagnosis not present

## 2022-03-21 DIAGNOSIS — E876 Hypokalemia: Secondary | ICD-10-CM | POA: Diagnosis not present

## 2022-03-21 DIAGNOSIS — I10 Essential (primary) hypertension: Secondary | ICD-10-CM | POA: Insufficient documentation

## 2022-03-21 DIAGNOSIS — R42 Dizziness and giddiness: Secondary | ICD-10-CM | POA: Diagnosis not present

## 2022-03-21 DIAGNOSIS — D72829 Elevated white blood cell count, unspecified: Secondary | ICD-10-CM | POA: Diagnosis not present

## 2022-03-21 LAB — COMPREHENSIVE METABOLIC PANEL
ALT: 14 U/L (ref 0–44)
AST: 19 U/L (ref 15–41)
Albumin: 3.6 g/dL (ref 3.5–5.0)
Alkaline Phosphatase: 64 U/L (ref 38–126)
Anion gap: 11 (ref 5–15)
BUN: 16 mg/dL (ref 8–23)
CO2: 23 mmol/L (ref 22–32)
Calcium: 8.6 mg/dL — ABNORMAL LOW (ref 8.9–10.3)
Chloride: 98 mmol/L (ref 98–111)
Creatinine, Ser: 0.66 mg/dL (ref 0.44–1.00)
GFR, Estimated: >60 mL/min (ref >60)
Glucose, Bld: 228 mg/dL — ABNORMAL HIGH (ref 70–99)
Potassium: 2.9 mmol/L — ABNORMAL LOW (ref 3.5–5.1)
Sodium: 132 mmol/L — ABNORMAL LOW (ref 135–145)
Total Bilirubin: 0.3 mg/dL (ref 0.3–1.2)
Total Protein: 6.5 g/dL (ref 6.5–8.1)

## 2022-03-21 LAB — CBC WITH DIFFERENTIAL/PLATELET
Abs Immature Granulocytes: 0.08 10*3/uL — ABNORMAL HIGH (ref 0.00–0.07)
Basophils Absolute: 0.1 10*3/uL (ref 0.0–0.1)
Basophils Relative: 1 %
Eosinophils Absolute: 0.1 10*3/uL (ref 0.0–0.5)
Eosinophils Relative: 1 %
HCT: 36.8 % (ref 36.0–46.0)
Hemoglobin: 12.6 g/dL (ref 12.0–15.0)
Immature Granulocytes: 1 %
Lymphocytes Relative: 11 %
Lymphs Abs: 1.4 10*3/uL (ref 0.7–4.0)
MCH: 28.8 pg (ref 26.0–34.0)
MCHC: 34.2 g/dL (ref 30.0–36.0)
MCV: 84 fL (ref 80.0–100.0)
Monocytes Absolute: 0.6 10*3/uL (ref 0.1–1.0)
Monocytes Relative: 5 %
Neutro Abs: 10.1 10*3/uL — ABNORMAL HIGH (ref 1.7–7.7)
Neutrophils Relative %: 81 %
Platelets: 305 10*3/uL (ref 150–400)
RBC: 4.38 MIL/uL (ref 3.87–5.11)
RDW: 12.9 % (ref 11.5–15.5)
WBC: 12.3 10*3/uL — ABNORMAL HIGH (ref 4.0–10.5)
nRBC: 0 % (ref 0.0–0.2)

## 2022-03-21 LAB — TROPONIN I (HIGH SENSITIVITY): Troponin I (High Sensitivity): 3 ng/L (ref <18)

## 2022-03-21 NOTE — ED Triage Notes (Signed)
Pt feeling light headed and nauseated today. Checked bp at home and it was in the [place]. Was told to come to the ER by triage nurse. ?

## 2022-03-21 NOTE — ED Provider Triage Note (Signed)
Emergency Medicine Provider Triage Evaluation Note ? ?Megan Gomez , a 61 y.o. female  was evaluated in triage.  Pt complains of dizziness, nausea, near syncope.  Patient reports that she had done some exercises on a resistance ball which she normally does in the evenings.  She has been in her normal state of health.  Afterwards she started to feel nauseous, and lightheaded and felt like she needed to vomit.  She checked her blood sugar given she is a diabetic and her sugar was 130.  She took her blood pressure and her blood pressure was elevated.  She does have a history of hypertension.  She takes hydrochlorothiazide and losartan.  She had stopped taking hydrochlorothiazide for quite some time last year given she did not like the side effect profile but restarted it again in January.  She has been compliant with her medications.  Blood pressure 183/98 here in triage.  She denies any chest pain or shortness of breath but states she still feels slightly nauseous and lightheaded though this is slightly improved. ? ?Review of Systems  ?Positive: As above ?Negative: As above ? ?Physical Exam  ?BP (!) 183/98   Pulse 93   Temp (!) 97.5 ?F (36.4 ?C) (Oral)   Resp 16   LMP 04/03/2012   SpO2 98%  ?Gen:   Awake, no distress   ?Resp:  Normal effort  ?MSK:   Moves extremities without difficulty  ?Other:  Well-appearing, speaking full sentences, regular rate and rhythm ? ?Medical Decision Making  ?Medically screening exam initiated at 10:17 PM.  Appropriate orders placed.  Camary Fulmore was informed that the remainder of the evaluation will be completed by another provider, this initial triage assessment does not replace that evaluation, and the importance of remaining in the ED until their evaluation is complete. ? ? ?  ?Garald Balding, PA-C ?03/21/22 2219 ? ?

## 2022-03-22 LAB — TROPONIN I (HIGH SENSITIVITY): Troponin I (High Sensitivity): <2 ng/L (ref <18)

## 2022-03-22 MED ORDER — POTASSIUM CHLORIDE CRYS ER 20 MEQ PO TBCR
40.0000 meq | EXTENDED_RELEASE_TABLET | Freq: Once | ORAL | Status: AC
  Administered 2022-03-22: 40 meq via ORAL
  Filled 2022-03-22: qty 2

## 2022-03-22 NOTE — Discharge Instructions (Addendum)
Your work up today was very reassuring. Your labs and chest xray were all normal, although it does seem that your potassium is a bit low. This may be from the HCTZ blood pressure medication which can cause potassium loss. I have given you a pill here, but please have your lab rechecked in about a week to ensure it's not trending back down.  ? ?If your blood pressure spikes again and you experience symptoms to include chest pain, headache and vision changes, please return to the ED. ?

## 2022-03-22 NOTE — ED Provider Notes (Signed)
?[facility] ?Provider Note ? ? ?CSN: [id number] ?Arrival date & time: 03/21/22  2156 ? ?  ? ?History ? ?Chief Complaint  ?Patient presents with  ? Hypertension  ? ? ?Megan Gomez is a 61 y.o. female with hx of HTN with on HCTZ and losartan who presents to the ED for evaluation of hypotensive episode at home last night.  Patient notes that her blood pressure normally runs around 937 systolic and was up to 169 for approximately 1 hour.  They called their insurance company and spoke to the nurse who refers to the emergency department for evaluation. She had taken her prescribed HCTZ in the morning as usual and is compliant with her medications. Pt did endorse some nausea and dizziness at the time but denies headache, vision changes, chest pain, sob, abd pain and vomiting.  ? ? ?Hypertension ? ? ?  ? ?Home Medications ?Prior to Admission medications   ?Medication Sig Start Date End Date Taking? Authorizing Provider  ?atorvastatin (LIPITOR) 40 MG tablet TAKE 1 TABLET BY MOUTH AT NIGHT FOR CHOLESTEROL 02/19/21   Orma Flaming, MD  ?B Complex Vitamins (B COMPLEX 1 PO) Place under the tongue.    [provider]  ?cholecalciferol (VITAMIN D3) 25 MCG (1000 UNIT) tablet Take 1,000 Units by mouth daily.    [provider]  ?escitalopram (LEXAPRO) 5 MG tablet TAKE 1 TABLET(5 MG) BY MOUTH DAILY 02/15/22   Jeanie Sewer, NP  ?losartan (COZAAR) 100 MG tablet TAKE 1 TABLET BY MOUTH DAILY FOR BLOOD PRESSURE 02/15/22   Jeanie Sewer, NP  ?Multiple Vitamin (MULTIVITAMIN) tablet Take 1 tablet by mouth daily.    [provider]  ?fexofenadine (ALLEGRA) 180 MG tablet Take 180 mg by mouth as needed.   02/15/12  [provider]  ?mometasone (NASONEX) 50 MCG/ACT nasal spray Place 2 sprays into the nose daily.    02/15/12  [provider]  ?   ? ?Allergies    ?Cephalexin, Chlorhexidine gluconate, Demerol, and Stadol [butorphanol tartrate]   ? ?Review  of Systems   ?Review of Systems ? ?Physical Exam ?Updated Vital Signs ?BP (!) 141/101 (BP Location: Left Arm)   Pulse 79   Temp 98.6 ?F (37 ?C) (Oral)   Resp 15   Ht '5\' 3"'$  (1.6 m)   Wt 68 kg   LMP 04/03/2012   SpO2 98%   BMI 26.57 kg/m?  ?Physical Exam ?Vitals and nursing note reviewed.  ?Constitutional:   ?   General: She is not in acute distress. ?   Appearance: She is not ill-appearing.  ?HENT:  ?   Head: Atraumatic.  ?Eyes:  ?   Conjunctiva/sclera: Conjunctivae normal.  ?Cardiovascular:  ?   Rate and Rhythm: Normal rate and regular rhythm.  ?   Pulses: Normal pulses.  ?   Heart sounds: No murmur heard. ?Pulmonary:  ?   Effort: Pulmonary effort is normal. No respiratory distress.  ?   Breath sounds: Normal breath sounds.  ?Abdominal:  ?   General: Abdomen is flat. There is no distension.  ?   Palpations: Abdomen is soft.  ?   Tenderness: There is no abdominal tenderness.  ?Musculoskeletal:     ?   General: Normal range of motion.  ?   Cervical back: Normal range of motion.  ?Skin: ?   General: Skin is warm and dry.  ?   Capillary Refill: Capillary refill takes less than 2 seconds.  ?Neurological:  ?   General:  No focal deficit present.  ?   Mental Status: She is alert.  ?Psychiatric:     ?   Mood and Affect: Mood normal.  ? ? ?ED Results / Procedures / Treatments   ?Labs ?(all labs ordered are listed, but only abnormal results are displayed) ?Labs Reviewed  ?CBC WITH DIFFERENTIAL/PLATELET - Abnormal; Notable for the following components:  ?    Result Value  ? WBC 12.3 (*)   ? Neutro Abs 10.1 (*)   ? Abs Immature Granulocytes 0.08 (*)   ? All other components within normal limits  ?COMPREHENSIVE METABOLIC PANEL - Abnormal; Notable for the following components:  ? Sodium 132 (*)   ? Potassium 2.9 (*)   ? Glucose, Bld 228 (*)   ? Calcium 8.6 (*)   ? All other components within normal limits  ?CBG MONITORING, ED  ?TROPONIN I (HIGH SENSITIVITY)  ?TROPONIN I (HIGH SENSITIVITY)  ? ? ?EKG ?EKG  Interpretation ? ?Date/Time:  Monday March 21 2022 22:33:50 EDT ?Ventricular Rate:  91 ?PR Interval:  166 ?QRS Duration: 84 ?QT Interval:  396 ?QTC Calculation: 487 ?R Axis:   -16 ?Text Interpretation: Normal sinus rhythm Cannot rule out Anterior infarct , age undetermined Abnormal ECG When compared with ECG of 13-Apr-2012 10:15, PREVIOUS ECG IS PRESENT no stemi similar to prior Confirmed by Wynona Dove ([id number]) on 03/22/2022 7:29:33 AM ? ?Radiology ?DG Chest 2 View ? ?Result Date: 03/21/2022 ?CLINICAL DATA:  Nausea lightheaded EXAM: CHEST - 2 VIEW COMPARISON:  08/22/2012 FINDINGS: The heart size and mediastinal contours are within normal limits. Both lungs are clear. The visualized skeletal structures are unremarkable. Partially visualized laparoscopic gastric band and tubing. Aortic atherosclerosis IMPRESSION: No active cardiopulmonary disease. Electronically Signed   By: Donavan Foil M.D.   On: 03/21/2022 22:58   ? ?Procedures ?Procedures  ? ? ?Medications Ordered in ED ?Medications  ?potassium chloride SA (KLOR-CON M) CR tablet 40 mEq (40 mEq Oral Given 03/22/22 0807)  ? ? ?ED Course/ Medical Decision Making/ A&P ?  ?                        ?Medical Decision Making ?Risk ?Prescription drug management. ? ? ?History:  ?Per HPI ?Social determinants of health: None ? ?Initial impression: ? ?This patient presents to the ED for concern of hypertension, this involves an extensive number of treatment options, and is a complaint that carries with it a high risk of complications and morbidity.    ? ? ?Lab Tests and EKG: ? ?I Ordered, reviewed, and interpreted labs and EKG.  The pertinent results include:  ?Potassium 2.9 ?CBC with leukocytosis 12.3 ?Delta troponin normal ? ? ?Imaging Studies ordered: ? ?I ordered imaging studies including  ?Chest x-ray without acute findings ?I independently visualized and interpreted imaging and I agree with the radiologist interpretation.  ? ? ?Cardiac Monitoring: ? ?The patient was  maintained on a cardiac monitor.  I personally viewed and interpreted the cardiac monitored which showed an underlying rhythm of: NSR ? ? ?Medicines ordered and prescription drug management: ? ?I ordered medication including: ?Potassium 40 mEq p.o. ?I have reviewed the patients home medicines and have made adjustments as needed ? ? ? ?ED Course: ?61 year old female in no acute distress, nontoxic-appearing presents to the ED for evaluation of a hypotensive episode last night.  While waiting in the lobby for approximately 10 hours, her blood pressure had spontaneously decreased to about 263 systolic.  Currently she is asymptomatic.  Vitals are otherwise normal.  Physical exam was benign without acute findings.  Labs were all reassuring, although she was noted to have potassium of 2.9, possibly wasting from her HCTZ diuretic.  Given 40 mEq potassium p.o. here in the emergency department and instructed to follow-up with primary care doctor in 1 week to reassess potassium.  Incidental finding of 12.3 leukocytosis on CBC, although patient does endorse a cough x1 week.  Overall, patient's work-up and physical exam is very reassuring given that her blood pressures returned to normal do not think any additional intervention is needed at this time.  She is stable for discharge. ? ?Disposition: ? ?After consideration of the diagnostic results, physical exam, history and the patients response to treatment feel that the patent would benefit from discharge.   ?Hypertension ?Hypokalemia: Plan of management as described above.  Patient has been expressed understanding and amenable to plan.  Discharged home in good condition. ? ? ? ?Final Clinical Impression(s) / ED Diagnoses ?Final diagnoses:  ?Hypertension, unspecified type  ?Hypokalemia  ? ? ?Rx / DC Orders ?ED Discharge Orders   ? ? None  ? ?  ? ? ?  ?Tonye Pearson, [place] ?03/22/22 7591 ? ?  ?Jeanell Sparrow, DO ?03/25/22 1620 ? ?

## 2022-03-23 ENCOUNTER — Telehealth: Payer: Self-pay | Admitting: Family

## 2022-03-23 ENCOUNTER — Ambulatory Visit: Payer: Federal, State, Local not specified - PPO | Admitting: Family

## 2022-03-23 ENCOUNTER — Encounter: Payer: Self-pay | Admitting: Family

## 2022-03-23 VITALS — BP 100/72 | HR 80 | Temp 97.6°F | Ht 63.0 in | Wt 153.2 lb

## 2022-03-23 DIAGNOSIS — E118 Type 2 diabetes mellitus with unspecified complications: Secondary | ICD-10-CM

## 2022-03-23 DIAGNOSIS — E782 Mixed hyperlipidemia: Secondary | ICD-10-CM | POA: Diagnosis not present

## 2022-03-23 DIAGNOSIS — I10 Essential (primary) hypertension: Secondary | ICD-10-CM

## 2022-03-23 LAB — LIPID PANEL
Cholesterol: 234 mg/dL — ABNORMAL HIGH (ref 0–200)
HDL: 62.9 mg/dL (ref >39.00)
LDL Cholesterol: 150 mg/dL — ABNORMAL HIGH (ref 0–99)
NonHDL: 171.3
Total CHOL/HDL Ratio: 4
Triglycerides: 107 mg/dL (ref 0.0–149.0)
VLDL: 21.4 mg/dL (ref 0.0–40.0)

## 2022-03-23 LAB — COMPREHENSIVE METABOLIC PANEL
ALT: 13 U/L (ref 0–35)
AST: 18 U/L (ref 0–37)
Albumin: 4.1 g/dL (ref 3.5–5.2)
Alkaline Phosphatase: 58 U/L (ref 39–117)
BUN: 19 mg/dL (ref 6–23)
CO2: 28 mEq/L (ref 19–32)
Calcium: 9.1 mg/dL (ref 8.4–10.5)
Chloride: 104 mEq/L (ref 96–112)
Creatinine, Ser: 0.56 mg/dL (ref 0.40–1.20)
GFR: 98.33 mL/min (ref >60.00)
Glucose, Bld: 128 mg/dL — ABNORMAL HIGH (ref 70–99)
Potassium: 3.7 mEq/L (ref 3.5–5.1)
Sodium: 140 mEq/L (ref 135–145)
Total Bilirubin: 0.5 mg/dL (ref 0.2–1.2)
Total Protein: 7.1 g/dL (ref 6.0–8.3)

## 2022-03-23 LAB — POCT GLYCOSYLATED HEMOGLOBIN (HGB A1C): Hemoglobin A1C: 6.6 % — AB (ref 4.0–5.6)

## 2022-03-23 MED ORDER — LOSARTAN POTASSIUM 100 MG PO TABS: 50.0000 mg | ORAL_TABLET | Freq: Two times a day (BID) | ORAL | 0 refills | Status: DC

## 2022-03-23 MED ORDER — METFORMIN HCL ER 500 MG PO TB24: 500.0000 mg | ORAL_TABLET | Freq: Two times a day (BID) | ORAL | 2 refills | Status: DC

## 2022-03-23 NOTE — Patient Instructions (Signed)
It was very nice to see you today! ? ?Go to the lab for blood work today. ?As discussed, cut the Losartan in half, and take 1/2 pill in am and other half in pm. ?Keep checking BP and let me know if running consistently > 130/90. ?Great job lowering your A1C! Keep it up! Today it is 6.6, goal is 6.0 or lower. ?When you need a refill of the Metformin I have changed it to '500mg'$  extended release. ? ?Follow up in 6 mos. ? ? ? ? ?PLEASE NOTE: ? ?If you had any lab tests please let us know if you have not heard back within a few days. You may see your results on MyChart before we have a chance to review them but we will give you a call once they are reviewed by [place]. If we ordered any referrals today, please let us know if you have not heard from their office within the next week.  ? ?Please try these tips to maintain a healthy lifestyle: ? ?Eat most of your calories during the day when you are active. Eliminate processed foods including packaged sweets (pies, cakes, cookies), reduce intake of potatoes, white bread, white pasta, and white rice. Look for whole grain options, oat flour or almond flour. ? ?Each meal should contain half fruits/vegetables, one quarter protein, and one quarter carbs (no bigger than a computer mouse). ? ?Cut down on sweet beverages. This includes juice, soda, and sweet tea. Also watch fruit intake, though this is a healthier sweet option, it still contains natural sugar! Limit to 3 servings daily. ? ?Drink at least 1 glass of water with each meal and aim for at least 8 glasses per day ? ?Exercise at least 150 minutes every week.  ? ?

## 2022-03-23 NOTE — Assessment & Plan Note (Addendum)
Chronic - high readings at home, had to go to ER 2 days ago d/t HTN, found to have hypokalemia, given K+ in ER and sent home, rechecking today. BP a little low. Advised pt to cut Losartan in half and take twice a day, continue to monitor BP at home & let me know readings >130/90. f/u 6 mos ?

## 2022-03-23 NOTE — Telephone Encounter (Signed)
Patient wants to know if we should take atorvastatin (LIPITOR) 40 MG tablet ?Off her medication list-  ? ?Patient also wants hudnell to complete a form from her insurance about the A1C- she stated Dr Eliberto Ivory used to stamp lab results and sent to her bcbs - I asked her to send forms to [place] so that we can review and document correctly.  ? ? ?Pt also stated she is eligible for a new BP machine through [place]. Stated she will be sending form for that as well.  ? ?

## 2022-03-23 NOTE — Assessment & Plan Note (Addendum)
Chronic - A1C today 6.6 dropped from 7.4.  Weight up 2 pounds, pt taking Metformin, 500 mg twice daily, has been taking daily.  Will order for next refill XR.  Follow-up in 6 months. ?

## 2022-03-23 NOTE — Progress Notes (Signed)
? ?Subjective:  ? ? ? Patient ID: Megan Gomez, female    DOB: 1961-03-18, 61 y.o.   MRN: [id number] ? ?Chief Complaint  ?Patient presents with  ? Follow-up  ?  Pt states she went to the ED on 03/20/22 for Hypertension. Symptoms were Dizziness, lightheadedness and tachycardia. Potassium was low.   ? ?HPI: ?Patient presents today for follow up of multiple medical problems. ? ?Diabetes Type 2 ?Pt is currently maintained on the following medications for diabetes: Metformin ER ?Denies polyuria/polydipsia. ?Denies hypoglycemia. ?Home glucose readings range not checking ?Lab Results  ?Component Value Date  ? HGBA1C 6.6 (A) 03/23/2022  ? HGBA1C 7.4 (H) 11/08/2021  ? HGBA1C 8.2 (H) 02/22/2021  ?  ?Lab Results  ?Component Value Date  ? MICROALBUR 1.1 08/26/2020  ? LDLCALC 115 (H) 02/22/2021  ? CREATININE 0.66 03/21/2022  ?Hyperlipidemia ?Patient is currently maintained on the following medication for hyperlipidemia: Lipitor ?Patient denies myalgia. ?Patient reports good compliance with low fat/low cholesterol diet.  ?Last lipid panel as follows:  ?Lab Results  ?Component Value Date  ? CHOL 198 11/08/2021  ? HDL 61.60 11/08/2021  ? LDLCALC 115 (H) 02/22/2021  ? LDLDIRECT 129.0 11/08/2021  ? TRIG 207.0 (H) 11/08/2021  ? CHOLHDL 3 11/08/2021  ?Hypertension: Patient is currently maintained on the following medications for blood pressure: Losartan ?Failed meds include: none ?Patient reports good compliance with blood pressure medications. ?Patient denies chest pain, headaches, shortness of breath or swelling. ?Last 3 blood pressure readings in our office are as follows: ?BP Readings from Last 3 Encounters:  ?03/23/22 100/72  ?03/22/22 (!) 141/101  ?11/08/21 130/90  ?  ? ?Assessment & Plan:  ? ?Problem List Items Addressed This Visit   ? ?  ? Cardiovascular and Mediastinum  ? Hypertension - Primary  ?  Chronic - high readings at home, had to go to ER 2 days ago d/t HTN, found to have hypokalemia, given K+ in ER and sent home,  rechecking today. BP a little low. Advised pt to cut Losartan in half and take twice a day, continue to monitor BP at home & let me know readings >130/90. f/u 6 mos ? ?  ?  ? Relevant Medications  ? losartan (COZAAR) 100 MG tablet  ? Other Relevant Orders  ? Comp Met (CMET)  ?  ? Endocrine  ? Controlled type 2 diabetes mellitus with complication, without long-term current use of insulin ([place])  ?  Chronic - A1C today 6.6 dropped from 7.4.  Weight up 2 pounds, pt taking Metformin, 500 mg twice daily, has been taking daily.  Will order for next refill XR.  Follow-up in 6 months. ? ?  ?  ? Relevant Medications  ? metFORMIN (GLUCOPHAGE-XR) 500 MG 24 hr tablet  ? losartan (COZAAR) 100 MG tablet  ? Other Relevant Orders  ? POCT HgB A1C (Completed)  ?  ? Other  ? Hyperlipidemia  ?  Chronic- taking Lipitor daily.  Checking lipids today.  Follow-up in 6 months. ? ?  ?  ? Relevant Medications  ? losartan (COZAAR) 100 MG tablet  ? Other Relevant Orders  ? Lipid panel  ? ? ?Outpatient Medications Prior to Visit  ?Medication Sig Dispense Refill  ? atorvastatin (LIPITOR) 40 MG tablet TAKE 1 TABLET BY MOUTH AT NIGHT FOR CHOLESTEROL 90 tablet 1  ? B Complex Vitamins (B COMPLEX 1 PO) Place under the tongue.    ? cholecalciferol (VITAMIN D3) 25 MCG (1000 UNIT) tablet Take 1,000 Units by mouth  daily.    ? escitalopram (LEXAPRO) 5 MG tablet TAKE 1 TABLET(5 MG) BY MOUTH DAILY 90 tablet 0  ? Multiple Vitamin (MULTIVITAMIN) tablet Take 1 tablet by mouth daily.    ? losartan (COZAAR) 100 MG tablet TAKE 1 TABLET BY MOUTH DAILY FOR BLOOD PRESSURE 90 tablet 0  ? metFORMIN (GLUCOPHAGE) 1000 MG tablet Take 1,000 mg by mouth 2 (two) times daily.    ? hydrochlorothiazide (HYDRODIURIL) 25 MG tablet Take 25 mg by mouth daily. (Patient not taking: Reported on 03/23/2022)    ? ?No facility-administered medications prior to visit.  ? ? ?Past Medical History:  ?Diagnosis Date  ? Diabetes mellitus   ? Hyperlipidemia   ? Hypertension   ? Rotator cuff tear    ? left  ? 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 Vitreomacular adhesion of right eye 10/06/2020  ? ? ?Past Surgical History:  ?Procedure Laterality Date  ? BREAST EXCISIONAL BIOPSY Left   ? BREATH TEK H PYLORI  04/04/2012  ? Procedure: BREATH TEK H PYLORI;  Surgeon: Shann Medal, MD;  Location: [facility];  Service: General;  Laterality: N/A;  ? CESAREAN SECTION  2001, 2005  ? EYE SURGERY  1995  ? Lasik  ? [place]  ? LAPAROSCOPIC GASTRIC BANDING  08/28/2012  ? Procedure: LAPAROSCOPIC GASTRIC BANDING;  Surgeon: Shann Medal, MD;  Location: [facility];  Service: General;  Laterality: N/A;  ? ? ?Allergies  ?Allergen Reactions  ? Butorphanol Nausea And Vomiting  ? Cephalexin Swelling  ? Chlorhexidine Gluconate Itching  ? Demerol Nausea And Vomiting  ? Stadol [Butorphanol Tartrate] Nausea And Vomiting  ? ? ?   ?Objective:  ?  ?Physical Exam ?Vitals and nursing note reviewed.  ?Constitutional:   ?   Appearance: Normal appearance.  ?Cardiovascular:  ?   Rate and Rhythm: Normal rate and regular rhythm.  ?Pulmonary:  ?   Effort: Pulmonary effort is normal.  ?   Breath sounds: Normal breath sounds.  ?Musculoskeletal:     ?   General: Normal range of motion.  ?Skin: ?   General: Skin is warm and dry.  ?Neurological:  ?   Mental Status: She is alert.  ?Psychiatric:     ?   Mood and Affect: Mood normal.     ?   Behavior: Behavior normal.  ? ? ?BP 100/72 (BP Location: Left Arm, Patient Position: Sitting, Cuff Size: Large)   Pulse 80   Temp 97.6 ?F (36.4 ?C) (Temporal)   Ht '5\' 3"'  (1.6 m)   Wt 153 lb 4 oz (69.5 kg)   LMP 04/03/2012   SpO2 94%   BMI 27.15 kg/m?  ?Wt Readings from Last 3 Encounters:  ?03/23/22 153 lb 4 oz (69.5 kg)  ?03/21/22 150 lb (68 kg)  ?11/08/21 156 lb 9.6 oz (71 kg)  ? ? ?   ? ?Jeanie Sewer, NP ? ?

## 2022-03-23 NOTE — Assessment & Plan Note (Signed)
Chronic- taking Lipitor daily.  Checking lipids today.  Follow-up in 6 months. ?

## 2022-03-25 NOTE — Telephone Encounter (Signed)
Ok that's fine.  Yes, she should be still taking the Lipitor. Has she not been taking? her cholesterol is high. ?Let me know what she says, thx. ?

## 2022-03-28 ENCOUNTER — Other Ambulatory Visit: Payer: Self-pay

## 2022-03-28 MED ORDER — ATORVASTATIN CALCIUM 40 MG PO TABS: ORAL_TABLET | ORAL | 1 refills | Status: DC

## 2022-03-28 NOTE — Telephone Encounter (Signed)
Per tammy - Patient has called back in regard.   ?  ?States that she has not been taking lipitor.  States she requested her pharmacy to send a refill request.  States pharmacy informed her that we have not responded. ?  ?I have advised patient to follow back up with our office in regard to refills if her pharmacy is having difficulty rather than not taking her medication. ?  ?Patient is requesting script to be sent to [facility] at [place].   ? ?RX sent ?

## 2022-03-28 NOTE — Telephone Encounter (Signed)
Lvm for pt to call back in regards to Lipitor. ?

## 2022-03-28 NOTE — Telephone Encounter (Signed)
great, thank you!

## 2022-03-28 NOTE — Telephone Encounter (Signed)
Patient has called back in regard.    States that she has not been taking lipitor.  States she requested her pharmacy to send a refill request.  States pharmacy informed her that we have not responded.  I have advised patient to follow back up with our office in regard to refills if her pharmacy is having difficulty rather than not taking her medication.  Patient is requesting script to be sent to [facility] at [address].

## 2022-04-18 ENCOUNTER — Telehealth: Payer: Self-pay | Admitting: Family

## 2022-04-18 NOTE — Telephone Encounter (Signed)
Patient would like her A1c to be sent to her insurance for the Diabetes management incentive program.   Patient sent the request via e-mail. I have printed form and is now in [employer].

## 2022-04-26 NOTE — Telephone Encounter (Signed)
Paper work received, Im just not able to print what is needed.

## 2022-04-26 NOTE — Telephone Encounter (Signed)
Patient requesting status update.  Please call patient at [phone].  Can leave VM if no answer.

## 2022-05-09 NOTE — Telephone Encounter (Signed)
Lvm to let pt know paperwork was faxed over to her insurance company 2 weeks ago.

## 2022-05-09 NOTE — Telephone Encounter (Signed)
Patient calling back to get a status update?  States she can pick up today b/c she is in the area.  Please give a call back at [phone].

## 2022-05-13 ENCOUNTER — Telehealth: Payer: Self-pay | Admitting: Family

## 2022-05-13 ENCOUNTER — Other Ambulatory Visit: Payer: Self-pay

## 2022-05-13 ENCOUNTER — Other Ambulatory Visit: Payer: Self-pay | Admitting: Family

## 2022-05-13 MED ORDER — ESCITALOPRAM OXALATE 5 MG PO TABS: ORAL_TABLET | ORAL | 0 refills | Status: DC

## 2022-05-13 NOTE — Telephone Encounter (Signed)
Please see below    [facility] [place] Night Provider Garret Reddish- MD Contact Type Call Who Is Calling Patient / Member / Family / Caregiver Caller Name Tinnie Kunin Phone Number [phone] Patient Name Megan Gomez Patient DOB [id number] Call Type Message Only Information Provided Reason for Call Medication Question / Request Initial Comment Caller States she has run out of her anxiety medicine and needs a refill Additional Comment states she not able to answer her phone at work a message can be left if possible

## 2022-05-13 NOTE — Telephone Encounter (Signed)
Rx sent 

## 2022-05-25 ENCOUNTER — Other Ambulatory Visit: Payer: Self-pay | Admitting: Family Medicine

## 2022-06-23 ENCOUNTER — Other Ambulatory Visit: Payer: Self-pay | Admitting: Family

## 2022-08-09 DIAGNOSIS — Z1231 Encounter for screening mammogram for malignant neoplasm of breast: Secondary | ICD-10-CM | POA: Diagnosis not present

## 2022-08-09 DIAGNOSIS — Z13820 Encounter for screening for osteoporosis: Secondary | ICD-10-CM | POA: Diagnosis not present

## 2022-08-09 DIAGNOSIS — Z7689 Persons encountering health services in other specified circumstances: Secondary | ICD-10-CM | POA: Diagnosis not present

## 2022-08-09 LAB — HM MAMMOGRAPHY

## 2022-08-22 ENCOUNTER — Encounter: Payer: Self-pay | Admitting: *Deleted

## 2022-08-22 ENCOUNTER — Other Ambulatory Visit: Payer: Self-pay | Admitting: Family

## 2022-08-22 DIAGNOSIS — I10 Essential (primary) hypertension: Secondary | ICD-10-CM

## 2022-08-23 ENCOUNTER — Other Ambulatory Visit: Payer: Self-pay | Admitting: Family

## 2022-08-23 DIAGNOSIS — I10 Essential (primary) hypertension: Secondary | ICD-10-CM

## 2022-09-21 ENCOUNTER — Ambulatory Visit: Payer: Federal, State, Local not specified - PPO | Admitting: Family

## 2022-10-03 ENCOUNTER — Telehealth: Payer: Self-pay | Admitting: Family

## 2022-10-03 DIAGNOSIS — I10 Essential (primary) hypertension: Secondary | ICD-10-CM

## 2022-10-05 ENCOUNTER — Other Ambulatory Visit: Payer: Self-pay

## 2022-10-05 DIAGNOSIS — I10 Essential (primary) hypertension: Secondary | ICD-10-CM

## 2022-10-05 MED ORDER — LOSARTAN POTASSIUM 100 MG PO TABS: 100.0000 mg | ORAL_TABLET | Freq: Every day | ORAL | 0 refills | Status: DC

## 2022-10-05 NOTE — Telephone Encounter (Signed)
Patient states: -[facility] on [address] informed her that they are out of stock of losartan 100 mg  - She was able to find that [facility] on [address] has it in stock   Patient requests: - PCP send in medication (Losartan 100 mg) to [facility] at [address] [place], [place] [address]

## 2022-10-05 NOTE — Telephone Encounter (Signed)
RX Sent. 

## 2022-10-06 ENCOUNTER — Encounter (INDEPENDENT_AMBULATORY_CARE_PROVIDER_SITE_OTHER): Payer: Federal, State, Local not specified - PPO | Admitting: Ophthalmology

## 2022-10-19 DIAGNOSIS — K080 Exfoliation of teeth due to systemic causes: Secondary | ICD-10-CM | POA: Diagnosis not present

## 2022-11-01 ENCOUNTER — Ambulatory Visit: Payer: Federal, State, Local not specified - PPO | Admitting: Family

## 2022-11-01 ENCOUNTER — Encounter: Payer: Self-pay | Admitting: Family

## 2022-11-01 VITALS — BP 170/95 | HR 85 | Temp 97.5°F | Ht 63.0 in | Wt 154.4 lb

## 2022-11-01 DIAGNOSIS — E118 Type 2 diabetes mellitus with unspecified complications: Secondary | ICD-10-CM

## 2022-11-01 DIAGNOSIS — I10 Essential (primary) hypertension: Secondary | ICD-10-CM

## 2022-11-01 DIAGNOSIS — F39 Unspecified mood [affective] disorder: Secondary | ICD-10-CM | POA: Diagnosis not present

## 2022-11-01 DIAGNOSIS — E782 Mixed hyperlipidemia: Secondary | ICD-10-CM | POA: Diagnosis not present

## 2022-11-01 DIAGNOSIS — E1169 Type 2 diabetes mellitus with other specified complication: Secondary | ICD-10-CM

## 2022-11-01 DIAGNOSIS — E785 Hyperlipidemia, unspecified: Secondary | ICD-10-CM

## 2022-11-01 LAB — LIPID PANEL
Cholesterol: 168 mg/dL (ref 0–200)
HDL: 65.8 mg/dL (ref >39.00)
LDL Cholesterol: 87 mg/dL (ref 0–99)
NonHDL: 101.7
Total CHOL/HDL Ratio: 3
Triglycerides: 72 mg/dL (ref 0.0–149.0)
VLDL: 14.4 mg/dL (ref 0.0–40.0)

## 2022-11-01 LAB — COMPREHENSIVE METABOLIC PANEL
ALT: 13 U/L (ref 0–35)
AST: 16 U/L (ref 0–37)
Albumin: 4.2 g/dL (ref 3.5–5.2)
Alkaline Phosphatase: 62 U/L (ref 39–117)
BUN: 10 mg/dL (ref 6–23)
CO2: 30 mEq/L (ref 19–32)
Calcium: 8.9 mg/dL (ref 8.4–10.5)
Chloride: 103 mEq/L (ref 96–112)
Creatinine, Ser: 0.57 mg/dL (ref 0.40–1.20)
GFR: 97.49 mL/min (ref >60.00)
Glucose, Bld: 104 mg/dL — ABNORMAL HIGH (ref 70–99)
Potassium: 3.9 mEq/L (ref 3.5–5.1)
Sodium: 140 mEq/L (ref 135–145)
Total Bilirubin: 0.5 mg/dL (ref 0.2–1.2)
Total Protein: 6.8 g/dL (ref 6.0–8.3)

## 2022-11-01 LAB — HEMOGLOBIN A1C: Hgb A1c MFr Bld: 7.1 % — ABNORMAL HIGH (ref 4.6–6.5)

## 2022-11-01 MED ORDER — METFORMIN HCL ER 500 MG PO TB24: 500.0000 mg | ORAL_TABLET | Freq: Two times a day (BID) | ORAL | 1 refills | Status: DC

## 2022-11-01 MED ORDER — LOSARTAN POTASSIUM 100 MG PO TABS: 50.0000 mg | ORAL_TABLET | Freq: Two times a day (BID) | ORAL | 1 refills | Status: DC

## 2022-11-01 MED ORDER — ESCITALOPRAM OXALATE 5 MG PO TABS: 5.0000 mg | ORAL_TABLET | Freq: Every day | ORAL | 1 refills | Status: DC

## 2022-11-01 NOTE — Progress Notes (Signed)
Patient ID: Megan Gomez, female    DOB: 1961-07-18, 61 y.o.   MRN: [id number]  Chief Complaint  Patient presents with  . Hypertension    Medication refill  . Diabetes   HPI: Hyperlipidemia: Patient is currently maintained on the following medication for hyperlipidemia: Lipitor.. Patient denies myalgia or other side effects. Patient reports good compliance with low fat/low cholesterol diet.  Last lipid panel as follows: Lab Results  Component Value Date   CHOL 234 (H) 03/23/2022   HDL 62.90 03/23/2022   LDLCALC 150 (H) 03/23/2022   LDLDIRECT 129.0 11/08/2021   TRIG 107.0 03/23/2022   CHOLHDL 4 03/23/2022   Hypertension: Patient is currently maintained on the following medications for blood pressure: Losartan Patient reports good compliance with blood pressure medications. Patient denies chest pain, headaches, shortness of breath or swelling. Last 3 blood pressure readings in our office are as follows: BP Readings from Last 3 Encounters:  11/01/22 (!) 170/95  03/23/22 100/72  03/22/22 (!) 141/101   Diabetes Type 2 Pt is currently maintained on the following medications for diabetes: Metformin ER Denies polyuria/polydipsia. Denies hypoglycemia. Home glucose readings range not checking   Assessment & Plan:   Problem List Items Addressed This Visit       Cardiovascular and Mediastinum   Hypertension     Endocrine   Controlled type 2 diabetes mellitus with complication, without long-term current use of insulin (HCC)    Subjective:    Outpatient Medications Prior to Visit  Medication Sig Dispense Refill  . atorvastatin (LIPITOR) 40 MG tablet TAKE 1 TABLET BY MOUTH AT NIGHT FOR CHOLESTEROL 30 tablet 0  . B Complex Vitamins (B COMPLEX 1 PO) Place under the tongue.    . cholecalciferol (VITAMIN D3) 25 MCG (1000 UNIT) tablet Take 1,000 Units by mouth daily.    Marland Kitchen escitalopram (LEXAPRO) 5 MG tablet TAKE 1 TABLET(5 MG) BY MOUTH DAILY 90 tablet 0  . losartan (COZAAR)  100 MG tablet Take 1 tablet (100 mg total) by mouth daily. for blood pressure 90 tablet 0  . metFORMIN (GLUCOPHAGE-XR) 500 MG 24 hr tablet Take 1 tablet (500 mg total) by mouth in the morning and at bedtime. 60 tablet 2  . Multiple Vitamin (MULTIVITAMIN) tablet Take 1 tablet by mouth daily.     No facility-administered medications prior to visit.   Past Medical History:  Diagnosis Date  . Diabetes mellitus   . Hyperlipidemia   . Hypertension   . Rotator cuff tear    left  . 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 Vitreomacular adhesion of right eye 10/06/2020   Past Surgical History:  Procedure Laterality Date  . BREAST EXCISIONAL BIOPSY Left   . BREATH TEK H PYLORI  04/04/2012   Procedure: BREATH TEK H PYLORI;  Surgeon: Shann Medal, MD;  Location: [facility];  Service: General;  Laterality: N/A;  . CESAREAN SECTION  2001, 2005  . EYE SURGERY  1995   Lasik  . INNER EAR SURGERY  1998  . LAPAROSCOPIC GASTRIC BANDING  08/28/2012   Procedure: LAPAROSCOPIC GASTRIC BANDING;  Surgeon: Shann Medal, MD;  Location: [facility];  Service: General;  Laterality: N/A;   Allergies  Allergen Reactions  . Butorphanol Nausea And Vomiting  . Cephalexin Swelling  . Chlorhexidine Gluconate Itching  . Demerol Nausea And Vomiting  . Stadol [Butorphanol Tartrate] Nausea And Vomiting      Objective:    Physical Exam BP (!) 170/95 (BP Location: Left Arm, Patient  Position: Sitting, Cuff Size: Normal)   Pulse 85   Temp (!) 97.5 F (36.4 C) (Temporal)   Ht '5\' 3"'$  (1.6 m)   Wt 154 lb 6 oz (70 kg)   LMP 04/03/2012   SpO2 99%   BMI 27.35 kg/m  Wt Readings from Last 3 Encounters:  11/01/22 154 lb 6 oz (70 kg)  03/23/22 153 lb 4 oz (69.5 kg)  03/21/22 150 lb (68 kg)       Jeanie Sewer, NP

## 2022-11-01 NOTE — Patient Instructions (Addendum)
It was very nice to see you today!   I will review your lab results via MyChart in a few days.  I have sent your refills to your pharmacy. Take your Losartan as soon as you get home!  I will check on the MTHR mutation and let you know when I review your labs.  Be sure you are hydrating with 2 liters water daily & eating a low sodium, low saturated fat diet!  Have a wonderful holiday season!   PLEASE NOTE:  If you had any lab tests please let us know if you have not heard back within a few days. You may see your results on MyChart before we have a chance to review them but we will give you a call once they are reviewed by [place]. If we ordered any referrals today, please let us know if you have not heard from their office within the next week.

## 2022-11-02 ENCOUNTER — Other Ambulatory Visit: Payer: Self-pay

## 2022-11-02 ENCOUNTER — Telehealth: Payer: Self-pay | Admitting: Family

## 2022-11-02 DIAGNOSIS — E118 Type 2 diabetes mellitus with unspecified complications: Secondary | ICD-10-CM

## 2022-11-02 MED ORDER — METFORMIN HCL ER 500 MG PO TB24: 500.0000 mg | ORAL_TABLET | Freq: Two times a day (BID) | ORAL | 1 refills | Status: DC

## 2022-11-02 NOTE — Telephone Encounter (Signed)
RX Sent. 

## 2022-11-02 NOTE — Telephone Encounter (Signed)
Pt states her Metformin has not been sent in. The order was put in but not sent to pharmacy. Please advise

## 2022-11-03 MED ORDER — ATORVASTATIN CALCIUM 40 MG PO TABS: ORAL_TABLET | ORAL | 1 refills | Status: DC

## 2022-11-03 NOTE — Assessment & Plan Note (Addendum)
chronic, stable, lipid panel wnl taking Lipitor '40mg'$  qd sending refill f/u 6 mos

## 2022-11-03 NOTE — Progress Notes (Signed)
Your glucose (sugar), electrolytes, kidney and liver function, and cholesterol numbers are all good.   A1C is up to 7.1 from 6.6! I would increase your Metformin to '750mg'$  twice a day. Or we can see if Wilder Glade or Vania Rea is covered by your ins. - we discussed these meds as helping to lower your A1C as well as protecting your kidneys from the diabetes. Continue the same dose of Lipitor you have been taking.  I checked into the MTHFR gene mutation that your said your sister has. It appears that this can increase your risk for clotting, stroke, & heart disease.  I found this article for you to look at from a medical resource I use:  [employer]  Let me know if you want me to go ahead and send a referral to the genetic office for testing, and let me know about the medication above!  Take care!

## 2022-11-03 NOTE — Assessment & Plan Note (Signed)
chronic - A1C back up to 7.1 taking Metformin ER '500mg'$  bid advised on increasing dose and/or adding [place]  pt will let me know f/u 3 mos

## 2022-11-03 NOTE — Assessment & Plan Note (Signed)
chronic - Unstable taking Losartan '50mg'$  bid BP high today, pt did not take am med advised to let me know what her home readings are sending refill f/u in 3 mos

## 2022-11-03 NOTE — Assessment & Plan Note (Signed)
chronic, stable taking Lexapro '5mg'$  qd sending refill f/u 6 mos

## 2022-11-10 ENCOUNTER — Encounter: Payer: Self-pay | Admitting: *Deleted

## 2022-11-22 ENCOUNTER — Telehealth: Payer: Self-pay | Admitting: Family

## 2022-11-22 NOTE — Telephone Encounter (Signed)
Patient states: [employer] has an Aeronautical engineer that awards a certain amount of money depending on patient's A1C  Patient requests: - Record of A1C w/ PCP's signature and office's name on it be printed out so she can pick it up and provide it for the incentive program.  - Would need this by 12/27 or 12/28

## 2022-11-22 NOTE — Telephone Encounter (Signed)
I called pt to let her know her paperwork is ready for pick up. Placed in files at front desk.

## 2022-12-28 LAB — HM DIABETES EYE EXAM

## 2023-01-31 ENCOUNTER — Ambulatory Visit: Payer: Federal, State, Local not specified - PPO | Admitting: Family

## 2023-03-20 ENCOUNTER — Other Ambulatory Visit: Payer: Self-pay | Admitting: Family

## 2023-03-20 DIAGNOSIS — E1169 Type 2 diabetes mellitus with other specified complication: Secondary | ICD-10-CM

## 2023-03-20 DIAGNOSIS — F39 Unspecified mood [affective] disorder: Secondary | ICD-10-CM

## 2023-03-29 DIAGNOSIS — Z124 Encounter for screening for malignant neoplasm of cervix: Secondary | ICD-10-CM | POA: Diagnosis not present

## 2023-03-29 DIAGNOSIS — Z01419 Encounter for gynecological examination (general) (routine) without abnormal findings: Secondary | ICD-10-CM | POA: Diagnosis not present

## 2023-03-29 DIAGNOSIS — Z1151 Encounter for screening for human papillomavirus (HPV): Secondary | ICD-10-CM | POA: Diagnosis not present

## 2023-03-29 DIAGNOSIS — Z6827 Body mass index (BMI) 27.0-27.9, adult: Secondary | ICD-10-CM | POA: Diagnosis not present

## 2023-04-04 ENCOUNTER — Ambulatory Visit: Payer: Federal, State, Local not specified - PPO | Admitting: Family

## 2023-04-19 ENCOUNTER — Encounter: Payer: Self-pay | Admitting: Internal Medicine

## 2023-05-15 ENCOUNTER — Encounter: Payer: Self-pay | Admitting: Family

## 2023-05-15 ENCOUNTER — Ambulatory Visit: Payer: Federal, State, Local not specified - PPO | Admitting: Family

## 2023-05-15 ENCOUNTER — Other Ambulatory Visit: Payer: Self-pay

## 2023-05-15 VITALS — BP 143/91 | HR 74 | Temp 94.4°F | Ht 63.0 in | Wt 153.0 lb

## 2023-05-15 DIAGNOSIS — E1169 Type 2 diabetes mellitus with other specified complication: Secondary | ICD-10-CM

## 2023-05-15 DIAGNOSIS — E118 Type 2 diabetes mellitus with unspecified complications: Secondary | ICD-10-CM

## 2023-05-15 DIAGNOSIS — Z7984 Long term (current) use of oral hypoglycemic drugs: Secondary | ICD-10-CM | POA: Diagnosis not present

## 2023-05-15 DIAGNOSIS — I10 Essential (primary) hypertension: Secondary | ICD-10-CM | POA: Diagnosis not present

## 2023-05-15 DIAGNOSIS — E785 Hyperlipidemia, unspecified: Secondary | ICD-10-CM

## 2023-05-15 LAB — HEMOGLOBIN A1C: Hgb A1c MFr Bld: 7 % — ABNORMAL HIGH (ref 4.6–6.5)

## 2023-05-15 LAB — MICROALBUMIN / CREATININE URINE RATIO
Creatinine,U: 70.9 mg/dL
Microalb Creat Ratio: 1 mg/g (ref 0.0–30.0)
Microalb, Ur: <0.7 mg/dL (ref 0.0–1.9)

## 2023-05-15 MED ORDER — METFORMIN HCL ER 500 MG PO TB24
500.0000 mg | ORAL_TABLET | Freq: Two times a day (BID) | ORAL | 1 refills | Status: DC
Start: 2023-05-15 — End: 2023-11-14

## 2023-05-15 MED ORDER — EMPAGLIFLOZIN 10 MG PO TABS
10.0000 mg | ORAL_TABLET | Freq: Every day | ORAL | 5 refills | Status: DC
Start: 2023-05-15 — End: 2023-11-14

## 2023-05-15 MED ORDER — LOSARTAN POTASSIUM 100 MG PO TABS
50.0000 mg | ORAL_TABLET | Freq: Two times a day (BID) | ORAL | 1 refills | Status: DC
Start: 2023-05-15 — End: 2023-09-18

## 2023-05-15 MED ORDER — ATORVASTATIN CALCIUM 40 MG PO TABS
ORAL_TABLET | ORAL | 0 refills | Status: DC
Start: 2023-05-15 — End: 2023-11-14

## 2023-05-15 NOTE — Patient Instructions (Addendum)
It was very nice to see you today!   I will review your lab results via MyChart in a few days.  I have sent your refills including the new medication Jardiance for your diabetes and kidney protection, let me know if your copay is too expensive OR if the pharmacy tells you that  Marcelline Deist is covered better and I will send this one in.  Continue monitoring your blood pressure and blood sugar (fasting) and let me know of any high readings.  Have a great summer!     PLEASE NOTE:  If you had any lab tests please let us know if you have not heard back within a few days. You may see your results on MyChart before we have a chance to review them but we will give you a call once they are reviewed by [place]. If we ordered any referrals today, please let us know if you have not heard from their office within the next week.

## 2023-05-15 NOTE — Assessment & Plan Note (Addendum)
chronic - Unstable taking Losartan 50mg  bid, pt ran out a few days ago BP high today advised to let me know what her home readings are sending refill f/u in 6 mos

## 2023-05-15 NOTE — Assessment & Plan Note (Addendum)
chronic - A1C back up to 7.1 taking Metformin ER 500mg  bid advised on increasing dose and/or adding SGLT2 after last labs, but pt did not get back to me, no change in pt weight rechecking A1C today, sending Jardiance 10mg  every day, advised on use & SE f/u 6 mos

## 2023-05-15 NOTE — Assessment & Plan Note (Signed)
chronic, stable, lipid panel wnl 10/2022 taking Lipitor 40mg  qd sending refill f/u 6 mos w/fasting labs

## 2023-05-15 NOTE — Progress Notes (Signed)
Patient ID: Megan Gomez, female    DOB: 1961-02-05, 62 y.o.   MRN: [id number]  Chief Complaint  Patient presents with   Diabetes   Hypertension   Hyperlipidemia    HPI: Hyperlipidemia: Patient is currently maintained on the following medication for hyperlipidemia: Lipitor 40mg  qd. Patient denies myalgia or other side effects. Patient reports good compliance with low fat/low cholesterol diet.  Last lipid panel as follows: Lab Results  Component Value Date   CHOL 168 11/01/2022   HDL 65.80 11/01/2022   LDLCALC 87 11/01/2022   LDLDIRECT 129.0 11/08/2021   TRIG 72.0 11/01/2022   CHOLHDL 3 11/01/2022   Hypertension: Patient is currently maintained on the following medications for blood pressure: Losartan 50mg  bid. Patient reports fair compliance with blood pressure medications - did not take today. Patient denies chest pain, headaches, shortness of breath or swelling. Last 3 blood pressure readings in our office are as follows: BP Readings from Last 3 Encounters:  05/15/23 (!) 143/91  11/01/22 (!) 170/95  03/23/22 100/72    Diabetes Type 2 Pt is currently maintained on the following medications for diabetes: Metformin ER Denies polyuria/polydipsia. Denies hypoglycemia. Home glucose readings range not checking Last A1C was  Lab Results  Component Value Date   HGBA1C 7.0 (H) 05/15/2023   HGBA1C 7.1 (H) 11/01/2022   HGBA1C 6.6 (A) 03/23/2022    Assessment & Plan:  Controlled type 2 diabetes mellitus with complication, without long-term current use of insulin (HCC) Assessment & Plan: chronic - A1C back up to 7.1 taking Metformin ER 500mg  bid advised on increasing dose and/or adding SGLT2 after last labs, but pt did not get back to me, no change in pt weight rechecking A1C today, sending Jardiance 10mg  every day, advised on use & SE f/u 6 mos  Orders: -     Microalbumin / creatinine urine ratio -     Hemoglobin A1c -     Empagliflozin; Take 1 tablet (10 mg total)  by mouth daily before breakfast.  Dispense: 30 tablet; Refill: 5 -     metFORMIN HCl ER; Take 1 tablet (500 mg total) by mouth 2 (two) times daily.  Dispense: 180 tablet; Refill: 1  Primary hypertension Assessment & Plan: chronic - Unstable taking Losartan 50mg  bid, pt ran out a few days ago BP high today advised to let me know what her home readings are sending refill f/u in 6 mos  Orders: -     Losartan Potassium; Take 0.5 tablets (50 mg total) by mouth 2 (two) times daily. for blood pressure  Dispense: 90 tablet; Refill: 1  Hyperlipidemia associated with type 2 diabetes mellitus (HCC) Assessment & Plan: chronic, stable, lipid panel wnl 10/2022 taking Lipitor 40mg  qd sending refill f/u 6 mos w/fasting labs  Orders: -     Atorvastatin Calcium; TAKE 1 TABLET BY MOUTH DAILY  Dispense: 90 tablet; Refill: 0   Subjective:    Outpatient Medications Prior to Visit  Medication Sig Dispense Refill   B Complex Vitamins (B COMPLEX 1 PO) Place under the tongue.     cholecalciferol (VITAMIN D3) 25 MCG (1000 UNIT) tablet Take 1,000 Units by mouth daily.     escitalopram (LEXAPRO) 5 MG tablet TAKE 1 TABLET(5 MG) BY MOUTH DAILY 90 tablet 1   Multiple Vitamin (MULTIVITAMIN) tablet Take 1 tablet by mouth daily.     atorvastatin (LIPITOR) 40 MG tablet TAKE 1 TABLET BY MOUTH DAILY 90 tablet 0   losartan (COZAAR) 100 MG tablet Take  0.5 tablets (50 mg total) by mouth 2 (two) times daily. for blood pressure 90 tablet 1   metFORMIN (GLUCOPHAGE-XR) 500 MG 24 hr tablet Take 1 tablet (500 mg total) by mouth in the morning and at bedtime. 180 tablet 1   No facility-administered medications prior to visit.   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   Vitreomacular adhesion of right eye 10/06/2020   Past Surgical History:  Procedure Laterality Date   BREAST EXCISIONAL BIOPSY Left    BREATH TEK H PYLORI  04/04/2012   Procedure: BREATH TEK H PYLORI;  Surgeon: Kandis Cocking, MD;  Location: [facility];  Service: General;  Laterality: N/A;   CESAREAN SECTION  2001, 2005   EYE SURGERY  1995   Lasik   INNER EAR SURGERY  1998   LAPAROSCOPIC GASTRIC BANDING  08/28/2012   Procedure: LAPAROSCOPIC GASTRIC BANDING;  Surgeon: Kandis Cocking, MD;  Location: [facility];  Service: General;  Laterality: N/A;   Allergies  Allergen Reactions   Butorphanol Nausea And Vomiting   Cephalexin Swelling   Chlorhexidine Gluconate Itching   Demerol Nausea And Vomiting   Stadol [Butorphanol Tartrate] Nausea And Vomiting      Objective:    Physical Exam Vitals and nursing note reviewed.  Constitutional:      Appearance: Normal appearance.  Cardiovascular:     Rate and Rhythm: Normal rate and regular rhythm.  Pulmonary:     Effort: Pulmonary effort is normal.     Breath sounds: Normal breath sounds.  Musculoskeletal:        General: Normal range of motion.  Skin:    General: Skin is warm and dry.  Neurological:     Mental Status: She is alert.  Psychiatric:        Mood and Affect: Mood normal.        Behavior: Behavior normal.    BP (!) 143/91   Pulse 74   Temp (!) 94.4 F (34.7 C) (Temporal)   Ht 5\' 3"  (1.6 m)   Wt 153 lb (69.4 kg)   LMP 04/03/2012   SpO2 98%   BMI 27.10 kg/m  Wt Readings from Last 3 Encounters:  05/15/23 153 lb (69.4 kg)  11/01/22 154 lb 6 oz (70 kg)  03/23/22 153 lb 4 oz (69.5 kg)      Dulce Sellar, NP

## 2023-05-16 ENCOUNTER — Telehealth: Payer: Self-pay | Admitting: Family

## 2023-05-16 MED ORDER — GLIPIZIDE ER 2.5 MG PO TB24
2.5000 mg | ORAL_TABLET | Freq: Every day | ORAL | 1 refills | Status: DC
Start: 2023-05-16 — End: 2024-05-21

## 2023-05-16 NOTE — Telephone Encounter (Signed)
Patient can't afford medication ,please advise

## 2023-05-16 NOTE — Telephone Encounter (Signed)
I've asked the pharmacy team to start a PA, let pt know we will let her know if approved. Thx

## 2023-05-16 NOTE — Addendum Note (Signed)
Addended byDulce Sellar on: 05/16/2023 09:27 PM   Modules accepted: Orders

## 2023-05-16 NOTE — Telephone Encounter (Signed)
Patient states Megan Gomez is pricey (over $700)-requests an alternative medication RX be sent to [id number] - [place], [place] - [address] AT [facility] [address] [facility] [address] Phone: [phone]  Fax: [phone]

## 2023-05-17 ENCOUNTER — Other Ambulatory Visit (HOSPITAL_COMMUNITY): Payer: Self-pay

## 2023-05-18 ENCOUNTER — Telehealth: Payer: Self-pay

## 2023-05-18 NOTE — Telephone Encounter (Signed)
Patient Advocate Encounter  Prior Authorization for Jaridance 10mg  tabs has been approved with [employer].    PA# [id number]  Effective dates: 04/18/23 through 05/17/24  Placed a call to [facility] to notify of the approval. Per the pharmacist, received a paid claim of $35.

## 2023-05-24 ENCOUNTER — Encounter: Payer: Self-pay | Admitting: Internal Medicine

## 2023-05-24 ENCOUNTER — Telehealth: Payer: Self-pay

## 2023-05-24 NOTE — Telephone Encounter (Signed)
-----   Message from Stephanie Hudnell, NP sent at 05/22/2023  9:32 PM EDT ----- Regarding: RE: Jardiance PA Please let Jazmon know her copay for the Jardiance should now be $35. Tell her we had to do a prior authorization with her insurance to get the cost down. Thx  ----- Message ----- From: Daniel, Natalie A, CPhT Sent: 05/18/2023   3:01 PM EDT To: Stephanie Hudnell, NP Subject: RE: Jardiance PA                               Good afternoon. A prior authorization has been done and the copay is now $35.00.  ----- Message ----- From: Hudnell, Stephanie, NP Sent: 05/16/2023   9:19 PM EDT To: Rx Prior Auth Team Subject: Jardiance PA                                   pt told by pharmacy Jardiance cost is $700? per our system it popped up $35 copay. Hoping that is after a prior auth is done?  Thanks!    

## 2023-05-24 NOTE — Telephone Encounter (Signed)
-----   Message from Dulce Sellar, NP sent at 05/22/2023  9:32 PM EDT ----- Regarding: RE: Jardiance PA Please let Megan Gomez know her copay for the Jardiance should now be $35. Tell her we had to do a prior authorization with her insurance to get the cost down. Thx  ----- Message ----- From: Dorien Chihuahua, CPhT Sent: 05/18/2023   3:01 PM EDT To: Dulce Sellar, NP Subject: RE: London Pepper PA                               Good afternoon. A prior authorization has been done and the copay is now $35.00.  ----- Message ----- From: Dulce Sellar, NP Sent: 05/16/2023   9:19 PM EDT To: Rx Prior Auth Team Subject: Jardiance PA                                   pt told by pharmacy Jardiance cost is $700? per our system it popped up $35 copay. Hoping that is after a prior Berkley Harvey is done?  Thanks!

## 2023-05-24 NOTE — Telephone Encounter (Signed)
I called pt in regards, pt gave a verbalized understanding.  

## 2023-05-24 NOTE — Telephone Encounter (Signed)
Mutiple attempts made to complete PV. Unable to reach patient. VM left. Pt to call the office back by 5 PM to have PV rescheduled. Pt made aware that in the event that we do not hear back from them their PV and scheduled procedure will be cancelled.  

## 2023-06-20 ENCOUNTER — Encounter: Payer: Federal, State, Local not specified - PPO | Admitting: Internal Medicine

## 2023-07-10 ENCOUNTER — Ambulatory Visit (AMBULATORY_SURGERY_CENTER): Payer: Federal, State, Local not specified - PPO

## 2023-07-10 VITALS — Ht 63.0 in | Wt 151.0 lb

## 2023-07-10 DIAGNOSIS — Z1211 Encounter for screening for malignant neoplasm of colon: Secondary | ICD-10-CM

## 2023-07-10 MED ORDER — PEG 3350-KCL-NA BICARB-NACL 420 G PO SOLR
4000.0000 mL | Freq: Once | ORAL | 0 refills | Status: AC
Start: 2023-07-10 — End: 2023-07-10

## 2023-07-10 NOTE — Progress Notes (Signed)
No egg or soy allergy known to patient  No issues known to pt with past sedation with any surgeries or procedures Patient denies ever being told they had issues or difficulty with intubation  No FH of Malignant Hyperthermia Pt is not on diet pills Pt is not on  home 02  Pt is not on blood thinners  Pt denies issues with constipation  No A fib or A flutter Have any cardiac testing pending--no  Patient's chart reviewed by John Nulty CNRA prior to previsit and patient appropriate for the [facility].  Previsit completed and red dot placed by patient's name on their procedure day (on provider's schedule).    

## 2023-07-18 ENCOUNTER — Encounter: Payer: Federal, State, Local not specified - PPO | Admitting: Internal Medicine

## 2023-08-02 ENCOUNTER — Encounter: Payer: Self-pay | Admitting: Internal Medicine

## 2023-08-02 ENCOUNTER — Ambulatory Visit (AMBULATORY_SURGERY_CENTER): Payer: Federal, State, Local not specified - PPO | Admitting: Internal Medicine

## 2023-08-02 VITALS — BP 151/80 | HR 74 | Temp 98.7°F | Resp 16 | Ht 63.0 in | Wt 151.0 lb

## 2023-08-02 DIAGNOSIS — Z1211 Encounter for screening for malignant neoplasm of colon: Secondary | ICD-10-CM

## 2023-08-02 DIAGNOSIS — K6389 Other specified diseases of intestine: Secondary | ICD-10-CM | POA: Diagnosis not present

## 2023-08-02 DIAGNOSIS — K635 Polyp of colon: Secondary | ICD-10-CM | POA: Diagnosis not present

## 2023-08-02 DIAGNOSIS — D123 Benign neoplasm of transverse colon: Secondary | ICD-10-CM

## 2023-08-02 MED ORDER — SODIUM CHLORIDE 0.9 % IV SOLN: 500.0000 mL | Freq: Once | INTRAVENOUS | Status: DC

## 2023-08-02 NOTE — Progress Notes (Signed)
Sedate, gd SR, tolerated procedure well, VSS, report to RN 

## 2023-08-02 NOTE — Op Note (Signed)
[facility] Patient Name: Megan Gomez Procedure Date: 08/02/2023 9:09 AM MRN: [id number] Endoscopist: Madelyn Brunner [place] , , [phone] Age: 62 Referring MD:  Date of Birth: 1961/11/25 Gender: Female Account #: [id number] Procedure:                Colonoscopy Indications:              Screening for colorectal malignant neoplasm Medicines:                Monitored Anesthesia Care Procedure:                Pre-Anesthesia Assessment:                           - Prior to the procedure, a History and Physical                            was performed, and patient medications and                            allergies were reviewed. The patient's tolerance of                            previous anesthesia was also reviewed. The risks                            and benefits of the procedure and the sedation                            options and risks were discussed with the patient.                            All questions were answered, and informed consent                            was obtained. Prior Anticoagulants: The patient has                            taken no anticoagulant or antiplatelet agents. ASA                            Grade Assessment: II - A patient with mild systemic                            disease. After reviewing the risks and benefits,                            the patient was deemed in satisfactory condition to                            undergo the procedure.                           After obtaining informed consent, the colonoscope  was passed under direct vision. Throughout the                            procedure, the patient's blood pressure, pulse, and                            oxygen saturations were monitored continuously. The                            Olympus PCF-[id number] (#[id number]) Colonoscope was                            introduced through the anus and advanced to the the                            terminal  ileum. The colonoscopy was performed                            without difficulty. The patient tolerated the                            procedure well. The quality of the bowel                            preparation was good. The terminal ileum, ileocecal                            valve, appendiceal orifice, and rectum were                            photographed. Scope In: 9:19:06 AM Scope Out: 9:31:22 AM Scope Withdrawal Time: 0 hours 10 minutes 9 seconds  Total Procedure Duration: 0 hours 12 minutes 16 seconds  Findings:                 The terminal ileum appeared normal.                           A 4 mm polyp was found in the transverse colon. The                            polyp was sessile. The polyp was removed with a                            cold snare. Resection and retrieval were complete.                           Non-bleeding internal hemorrhoids were found during                            retroflexion. Complications:            No immediate complications. Estimated Blood Loss:     Estimated blood loss was minimal. Impression:               - The examined  portion of the ileum was normal.                           - One 4 mm polyp in the transverse colon, removed                            with a cold snare. Resected and retrieved.                           - Non-bleeding internal hemorrhoids. Recommendation:           - Discharge patient to home (with escort).                           - Await pathology results.                           - The findings and recommendations were discussed                            with the patient. Dr Particia Lather "Alan Ripper" Leonides Schanz,  08/02/2023 9:36:15 AM

## 2023-08-02 NOTE — Progress Notes (Signed)
GASTROENTEROLOGY PROCEDURE H&P NOTE   Primary Care Physician: Dulce Sellar, NP    Reason for Procedure:   Colon cancer screening  Plan:    Colonoscopy  Patient is appropriate for endoscopic procedure(s) in the ambulatory ([facility]) setting.  The nature of the procedure, as well as the risks, benefits, and alternatives were carefully and thoroughly reviewed with the patient. Ample time for discussion and questions allowed. The patient understood, was satisfied, and agreed to proceed.     HPI: Megan Gomez is a 62 y.o. female who presents for colonoscopy for colon cancer screening. Denies blood in stools, changes in bowel habits, or unintentional weight loss. Denies family history of colon cancer. Last colonoscopy was 10 years ago and was normal.  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   Vitreomacular adhesion of right eye 10/06/2020    Past Surgical History:  Procedure Laterality Date   BREAST EXCISIONAL BIOPSY Left 2001   BREATH TEK H PYLORI  04/04/2012   Procedure: BREATH TEK H PYLORI;  Surgeon: Kandis Cocking, MD;  Location: [facility];  Service: General;  Laterality: N/A;   CESAREAN SECTION  2001, 2005   COLONOSCOPY  2013   EYE SURGERY  1995   Lasik   INNER EAR SURGERY  1998   LAPAROSCOPIC GASTRIC BANDING  08/28/2012   Procedure: LAPAROSCOPIC GASTRIC BANDING;  Surgeon: Kandis Cocking, MD;  Location: [facility];  Service: General;  Laterality: N/A;   STOMACH SURGERY      Prior to Admission medications   Medication Sig Start Date End Date Taking? Authorizing Provider  atorvastatin (LIPITOR) 40 MG tablet TAKE 1 TABLET BY MOUTH DAILY 05/15/23  Yes Hudnell,  Judeth Cornfield, NP  cholecalciferol (VITAMIN D3) 25 MCG (1000 UNIT) tablet Take 1,000 Units by mouth daily.   Yes [provider]  losartan (COZAAR) 100 MG tablet Take 0.5 tablets (50 mg total) by mouth 2 (two) times daily. for blood pressure 05/15/23  Yes Hudnell, Judeth Cornfield, NP  metFORMIN (GLUCOPHAGE-XR) 500 MG 24 hr tablet Take 1 tablet (500 mg total) by mouth 2 (two) times daily. 05/15/23  Yes Dulce Sellar, NP  Multiple Vitamin (MULTIVITAMIN) tablet Take 1 tablet by mouth daily.   Yes [provider]  acetaminophen (TYLENOL) 325 MG tablet Take 650 mg by mouth every 6 (six) hours as needed.    [provider]  B Complex Vitamins (B COMPLEX 1 PO) Place under the tongue.    [provider]  calcium carbonate (TUMS - DOSED IN MG ELEMENTAL CALCIUM) 500 MG chewable tablet Chew 1 tablet by mouth daily.    [provider]  empagliflozin (JARDIANCE) 10 MG TABS tablet Take 1 tablet (10 mg total) by mouth daily before breakfast. Patient not taking: Reported on 08/02/2023 05/15/23   Dulce Sellar, NP  escitalopram (LEXAPRO) 5 MG tablet TAKE 1 TABLET(5 MG) BY MOUTH DAILY 03/20/23   Dulce Sellar, NP  glipiZIDE (GLUCOTROL XL) 2.5 MG 24 hr tablet Take 1 tablet (2.5 mg total) by mouth daily with breakfast. Or lunch. Patient not taking: Reported on 08/02/2023 05/16/23   Dulce Sellar, NP  fexofenadine (ALLEGRA) 180 MG tablet  Take 180 mg by mouth as needed.   02/15/12  [provider]  mometasone (NASONEX) 50 MCG/ACT nasal spray Place 2 sprays into the nose daily.    02/15/12  [provider]    Current Outpatient Medications  Medication Sig Dispense Refill   atorvastatin (LIPITOR) 40 MG tablet TAKE 1 TABLET BY MOUTH DAILY 90 tablet 0   cholecalciferol (VITAMIN D3) 25 MCG (1000 UNIT) tablet Take 1,000 Units by mouth daily.     losartan (COZAAR) 100 MG tablet Take 0.5 tablets (50 mg total) by mouth 2 (two) times daily. for blood pressure 90  tablet 1   metFORMIN (GLUCOPHAGE-XR) 500 MG 24 hr tablet Take 1 tablet (500 mg total) by mouth 2 (two) times daily. 180 tablet 1   Multiple Vitamin (MULTIVITAMIN) tablet Take 1 tablet by mouth daily.     acetaminophen (TYLENOL) 325 MG tablet Take 650 mg by mouth every 6 (six) hours as needed.     B Complex Vitamins (B COMPLEX 1 PO) Place under the tongue.     calcium carbonate (TUMS - DOSED IN MG ELEMENTAL CALCIUM) 500 MG chewable tablet Chew 1 tablet by mouth daily.     empagliflozin (JARDIANCE) 10 MG TABS tablet Take 1 tablet (10 mg total) by mouth daily before breakfast. (Patient not taking: Reported on 08/02/2023) 30 tablet 5   escitalopram (LEXAPRO) 5 MG tablet TAKE 1 TABLET(5 MG) BY MOUTH DAILY 90 tablet 1   glipiZIDE (GLUCOTROL XL) 2.5 MG 24 hr tablet Take 1 tablet (2.5 mg total) by mouth daily with breakfast. Or lunch. (Patient not taking: Reported on 08/02/2023) 90 tablet 1   Current Facility-Administered Medications  Medication Dose Route Frequency Provider Last Rate Last Admin   0.9 %  sodium chloride infusion  500 mL Intravenous Once Imogene Burn, MD        Allergies as of 08/02/2023 - Review Complete 08/02/2023  Allergen Reaction Noted   Butorphanol Nausea And Vomiting 03/23/2022   Cephalexin Swelling 08/16/2012   Chlorhexidine gluconate Itching 08/28/2012   Demerol Nausea And Vomiting 02/15/2012   Stadol [butorphanol tartrate] Nausea And Vomiting 02/15/2012    Family History  Problem Relation Age of Onset   Diabetes Mother    Stroke Father    Cancer Sister        breast cancer    Diabetes Sister    Breast cancer Sister    Diabetes Brother    Colon cancer Neg Hx    Colon polyps Neg Hx    Esophageal cancer Neg Hx    Rectal cancer Neg Hx    Stomach cancer Neg Hx     Social History   Socioeconomic History   Marital status: Married    Spouse name: Not on file   Number of children: Not on file   Years of education: Not on file   Highest education level: Not on  file  Occupational History   Not on file  Tobacco Use   Smoking status: Former    Current packs/day: 0.00    Average packs/day: 1 pack/day for 10.0 years (10.0 ttl pk-yrs)    Types: Cigarettes    Start date: 11/28/1989    Quit date: 11/29/1999    Years since quitting: 23.6   Smokeless tobacco: Never  Vaping Use   Vaping status: Never Used  Substance and Sexual Activity   Alcohol use: Yes    Comment: socially-every few months   Drug use: Never   Sexual activity: Yes  Birth control/protection: Post-menopausal  Other Topics Concern   Not on file  Social History Narrative   Not on file   Social Determinants of Health   Financial Resource Strain: Low Risk  (04/03/2018)   Overall Financial Resource Strain (CARDIA)    Difficulty of Paying Living Expenses: Not hard at all  Food Insecurity: No Food Insecurity (04/03/2018)   Hunger Vital Sign    Worried About Running Out of Food in the Last Year: Never true    Ran Out of Food in the Last Year: Never true  Transportation Needs: No Transportation Needs (04/03/2018)   PRAPARE - Administrator, Civil Service (Medical): No    Lack of Transportation (Non-Medical): No  Physical Activity: Insufficiently Active (04/03/2018)   Exercise Vital Sign    Days of Exercise per Week: 3 days    Minutes of Exercise per Session: 30 min  Stress: No Stress Concern Present (04/03/2018)   [employer] of Occupational Health - Occupational Stress Questionnaire    Feeling of Stress : Only a little  Social Connections: Unknown (04/11/2022)   [facility]   Social Network    Social Network: Not on file  Intimate Partner Violence: Unknown (03/03/2022)   Received from [facility]   HITS    Physically Hurt: Not on file    Insult or Talk Down To: Not on file    Threaten Physical Harm: Not on file    Scream or Curse: Not on file    Physical Exam: Vital signs in last 24 hours: BP 135/85   Pulse 87   Temp 98.7 F (37.1 C)   Ht 5'  3" (1.6 m)   Wt 151 lb (68.5 kg)   LMP 04/03/2012   SpO2 99%   BMI 26.75 kg/m  GEN: NAD EYE: Sclerae anicteric ENT: MMM CV: Non-tachycardic Pulm: No increased work of breathing GI: Soft, NT/ND NEURO:  Alert & Oriented   Eulah Pont, MD [facility]  08/02/2023 8:43 AM

## 2023-08-02 NOTE — Progress Notes (Signed)
Called to room to assist during endoscopic procedure.  Patient ID and intended procedure confirmed with present staff. Received instructions for my participation in the procedure from the performing physician.  

## 2023-08-02 NOTE — Patient Instructions (Signed)
Impression/Recommendations:  Polyp and hemorrhoid handouts given to patient.  Await pathology results.  YOU HAD AN ENDOSCOPIC PROCEDURE TODAY AT [facility]:   Refer to the procedure report that was given to you for any specific questions about what was found during the examination.  If the procedure report does not answer your questions, please call your gastroenterologist to clarify.  If you requested that your care partner not be given the details of your procedure findings, then the procedure report has been included in a sealed envelope for you to review at your convenience later.  YOU SHOULD EXPECT: Some feelings of bloating in the abdomen. Passage of more gas than usual.  Walking can help get rid of the air that was put into your GI tract during the procedure and reduce the bloating. If you had a lower endoscopy (such as a colonoscopy or flexible sigmoidoscopy) you may notice spotting of blood in your stool or on the toilet paper. If you underwent a bowel prep for your procedure, you may not have a normal bowel movement for a few days.  Please Note:  You might notice some irritation and congestion in your nose or some drainage.  This is from the oxygen used during your procedure.  There is no need for concern and it should clear up in a day or so.  SYMPTOMS TO REPORT IMMEDIATELY:  Following lower endoscopy (colonoscopy or flexible sigmoidoscopy):  Excessive amounts of blood in the stool  Significant tenderness or worsening of abdominal pains  Swelling of the abdomen that is new, acute  Fever of 100F or higher For urgent or emergent issues, a gastroenterologist can be reached at any hour by calling [phone]. Do not use MyChart messaging for urgent concerns.    DIET:  We do recommend a small meal at first, but then you may proceed to your regular diet.  Drink plenty of fluids but you should avoid alcoholic beverages for 24 hours.  ACTIVITY:  You should plan to  take it easy for the rest of today and you should NOT DRIVE or use heavy machinery until tomorrow (because of the sedation medicines used during the test).    FOLLOW UP: Our staff will call the number listed on your records the next business day following your procedure.  We will call around 7:15- 8:00 am to check on you and address any questions or concerns that you may have regarding the information given to you following your procedure. If we do not reach you, we will leave a message.     If any biopsies were taken you will be contacted by phone or by letter within the next 1-3 weeks.  Please call us at [phone] if you have not heard about the biopsies in 3 weeks.    SIGNATURES/CONFIDENTIALITY: You and/or your care partner have signed paperwork which will be entered into your electronic medical record.  These signatures attest to the fact that that the information above on your After Visit Summary has been reviewed and is understood.  Full responsibility of the confidentiality of this discharge information lies with you and/or your care-partner.

## 2023-08-03 ENCOUNTER — Telehealth: Payer: Self-pay | Admitting: *Deleted

## 2023-08-03 NOTE — Telephone Encounter (Signed)
  Follow up Call-     08/02/2023    8:24 AM  Call back number  Post procedure Call Back phone  # [phone]  Permission to leave phone message Yes     Patient questions:  Do you have a fever, pain , or abdominal swelling? No. Pain Score  0 *  Have you tolerated food without any problems? Yes.    Have you been able to return to your normal activities? Yes.    Do you have any questions about your discharge instructions: Diet   No. Medications  No. Follow up visit  No.  Do you have questions or concerns about your Care? No.  Actions: * If pain score is 4 or above: No action needed, pain <4.

## 2023-08-04 ENCOUNTER — Encounter: Payer: Self-pay | Admitting: Internal Medicine

## 2023-09-16 ENCOUNTER — Other Ambulatory Visit: Payer: Self-pay | Admitting: Family

## 2023-09-16 DIAGNOSIS — I10 Essential (primary) hypertension: Secondary | ICD-10-CM

## 2023-09-25 DIAGNOSIS — Z1231 Encounter for screening mammogram for malignant neoplasm of breast: Secondary | ICD-10-CM | POA: Diagnosis not present

## 2023-10-23 DIAGNOSIS — H43821 Vitreomacular adhesion, right eye: Secondary | ICD-10-CM | POA: Diagnosis not present

## 2023-10-23 DIAGNOSIS — H2513 Age-related nuclear cataract, bilateral: Secondary | ICD-10-CM | POA: Diagnosis not present

## 2023-10-23 DIAGNOSIS — E119 Type 2 diabetes mellitus without complications: Secondary | ICD-10-CM | POA: Diagnosis not present

## 2023-11-14 ENCOUNTER — Ambulatory Visit: Payer: Federal, State, Local not specified - PPO | Admitting: Family

## 2023-11-14 ENCOUNTER — Encounter: Payer: Self-pay | Admitting: Family

## 2023-11-14 VITALS — BP 164/87 | HR 82 | Temp 98.7°F | Ht 63.0 in | Wt 152.2 lb

## 2023-11-14 DIAGNOSIS — E1169 Type 2 diabetes mellitus with other specified complication: Secondary | ICD-10-CM

## 2023-11-14 DIAGNOSIS — E118 Type 2 diabetes mellitus with unspecified complications: Secondary | ICD-10-CM | POA: Diagnosis not present

## 2023-11-14 DIAGNOSIS — Z7984 Long term (current) use of oral hypoglycemic drugs: Secondary | ICD-10-CM | POA: Diagnosis not present

## 2023-11-14 DIAGNOSIS — E1159 Type 2 diabetes mellitus with other circulatory complications: Secondary | ICD-10-CM

## 2023-11-14 DIAGNOSIS — Z23 Encounter for immunization: Secondary | ICD-10-CM

## 2023-11-14 DIAGNOSIS — E785 Hyperlipidemia, unspecified: Secondary | ICD-10-CM

## 2023-11-14 DIAGNOSIS — I152 Hypertension secondary to endocrine disorders: Secondary | ICD-10-CM

## 2023-11-14 DIAGNOSIS — F39 Unspecified mood [affective] disorder: Secondary | ICD-10-CM

## 2023-11-14 LAB — MICROALBUMIN / CREATININE URINE RATIO
Creatinine,U: 91.9 mg/dL
Microalb Creat Ratio: 1.6 mg/g (ref 0.0–30.0)
Microalb, Ur: 1.4 mg/dL (ref 0.0–1.9)

## 2023-11-14 LAB — HEMOGLOBIN A1C: Hgb A1c MFr Bld: 7.2 % — ABNORMAL HIGH (ref 4.6–6.5)

## 2023-11-14 MED ORDER — ATORVASTATIN CALCIUM 40 MG PO TABS: ORAL_TABLET | ORAL | 3 refills | Status: DC

## 2023-11-14 MED ORDER — METFORMIN HCL ER 500 MG PO TB24: 500.0000 mg | ORAL_TABLET | Freq: Two times a day (BID) | ORAL | 3 refills | Status: DC

## 2023-11-14 MED ORDER — ESCITALOPRAM OXALATE 5 MG PO TABS: 5.0000 mg | ORAL_TABLET | Freq: Every day | ORAL | 3 refills | Status: DC

## 2023-11-14 MED ORDER — OLMESARTAN MEDOXOMIL 20 MG PO TABS: 20.0000 mg | ORAL_TABLET | Freq: Every day | ORAL | 5 refills | Status: DC

## 2023-11-14 NOTE — Assessment & Plan Note (Signed)
chronic, stable taking Lexapro 5mg  qd, but not daily sending refill f/u 6 mos

## 2023-11-14 NOTE — Assessment & Plan Note (Addendum)
chronic, Unstable Patient has been taking Losartan 100mg  in the morning. BP elevated today. However, Losartan's duration of action was discussed as a potential cause. -Switch Losartan to Olmesartan 40mg  for better 24-hour control. -Follow-up appointment in 3 mos to assess response to medication changes.

## 2023-11-14 NOTE — Progress Notes (Signed)
Patient ID: Megan Gomez, female    DOB: 22-Aug-1961, 62 y.o.   MRN: [id number]  Chief Complaint  Patient presents with   Diabetes   Hypertension     Discussed the use of AI scribe software for clinical note transcription with the patient, who gave verbal consent to proceed.  History of Present Illness   The patient, with a history of hypertension and diabetes, presents for a follow-up visit. She reports waking up with headaches, which she believes may be related to her toothpaste. She has not been monitoring her blood pressure at home but has been taking the Losartan 100mg  qam. Last visit we discussed taking 1/2 pill bid, but pt did not do this. She admits to not taking her prescribed T2DM medication Jardiance (she believes d/t expense) also is not taking the glipizide, prescribed last visit. The patient denies experiencing any dizziness. She also reports occasional nocturnal awakenings to use the restroom. The patient has had any changes in her diet or lifestyle that could affect her blood sugar levels.   Anxiety/Depression: Patient complains of anxiety disorder and mild depression .  She has the following symptoms: difficulty concentrating, irritable, racing thoughts. Onset of symptoms was approximately  years ago, She denies current suicidal and homicidal ideation. Risk factors:  chronic medical problems   Current treatment includes Lexapro 5mg  qd, or sometimes she takes prn.    Hyperlipidemia: Patient is currently maintained on the following medication for hyperlipidemia: Lipitor 40mg . Patient denies myalgia or other side effects. Patient reports good compliance with low fat/low cholesterol diet.  Last lipid panel as follows: Lab Results  Component Value Date   CHOL 168 11/01/2022   HDL 65.80 11/01/2022   LDLCALC 87 11/01/2022   LDLDIRECT 129.0 11/08/2021   TRIG 72.0 11/01/2022   CHOLHDL 3 11/01/2022   Assessment & Plan:  Controlled type 2 diabetes mellitus with complication,  without long-term current use of insulin (HCC) -     Microalbumin / creatinine urine ratio -     Hemoglobin A1c -     HM DIABETES FOOT EXAM -     metFORMIN HCl ER; Take 1 tablet (500 mg total) by mouth 2 (two) times daily.  Dispense: 180 tablet; Refill: 3  Immunization due -     Pneumococcal conjugate vaccine 20-valent  Hypertension associated with diabetes (HCC) Assessment & Plan: chronic, Unstable Patient has been taking Losartan 100mg  in the morning. BP elevated today. However, Losartan's duration of action was discussed as a potential cause. -Switch Losartan to Olmesartan 40mg  for better 24-hour control. -Follow-up appointment in 3 mos to assess response to medication changes.   Orders: -     Olmesartan Medoxomil; Take 1 tablet (20 mg total) by mouth daily.  Dispense: 30 tablet; Refill: 5  Hyperlipidemia associated with type 2 diabetes mellitus (HCC) Assessment & Plan: chronic, stable last lipids 10/2022 wnl taking Lipitor 40mg  qd sending refill f/u 3 mos w/fasting labs  Orders: -     Atorvastatin Calcium; TAKE 1 TABLET BY MOUTH DAILY  Dispense: 90 tablet; Refill: 3  Mood disorder (HCC) Assessment & Plan: chronic, stable taking Lexapro 5mg  qd, but not daily sending refill f/u 6 mos  Orders: -     Escitalopram Oxalate; Take 1 tablet (5 mg total) by mouth daily.  Dispense: 90 tablet; Refill: 3  Immunization due - Pneumococcal conjugate vaccine 20-valent (Prevnar 20)  Subjective:    Outpatient Medications Prior to Visit  Medication Sig Dispense Refill   acetaminophen (TYLENOL) 325 MG  tablet Take 650 mg by mouth every 6 (six) hours as needed.     B Complex Vitamins (B COMPLEX 1 PO) Place under the tongue.     calcium carbonate (TUMS - DOSED IN MG ELEMENTAL CALCIUM) 500 MG chewable tablet Chew 1 tablet by mouth daily.     cholecalciferol (VITAMIN D3) 25 MCG (1000 UNIT) tablet Take 1,000 Units by mouth daily.     Multiple Vitamin (MULTIVITAMIN) tablet Take 1 tablet  by mouth daily.     atorvastatin (LIPITOR) 40 MG tablet TAKE 1 TABLET BY MOUTH DAILY 90 tablet 0   escitalopram (LEXAPRO) 5 MG tablet TAKE 1 TABLET(5 MG) BY MOUTH DAILY 90 tablet 1   losartan (COZAAR) 100 MG tablet TAKE 1/2 TABLET(50 MG) BY MOUTH TWICE DAILY FOR BLOOD PRESSURE 90 tablet 1   metFORMIN (GLUCOPHAGE-XR) 500 MG 24 hr tablet Take 1 tablet (500 mg total) by mouth 2 (two) times daily. 180 tablet 1   glipiZIDE (GLUCOTROL XL) 2.5 MG 24 hr tablet Take 1 tablet (2.5 mg total) by mouth daily with breakfast. Or lunch. (Patient not taking: Reported on 11/14/2023) 90 tablet 1   empagliflozin (JARDIANCE) 10 MG TABS tablet Take 1 tablet (10 mg total) by mouth daily before breakfast. (Patient not taking: Reported on 11/14/2023) 30 tablet 5   0.9 %  sodium chloride infusion      No facility-administered medications prior to visit.   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   Vitreomacular adhesion of right eye 10/06/2020   Past Surgical History:  Procedure Laterality Date   BREAST EXCISIONAL BIOPSY Left 2001   BREATH TEK H PYLORI  04/04/2012   Procedure: BREATH TEK H PYLORI;  Surgeon: Kandis Cocking, MD;  Location: [facility];  Service: General;  Laterality: N/A;   CESAREAN SECTION  2001, 2005   COLONOSCOPY  2013   EYE SURGERY  1995   Lasik   INNER EAR SURGERY  1998   LAPAROSCOPIC GASTRIC BANDING  08/28/2012   Procedure: LAPAROSCOPIC GASTRIC BANDING;  Surgeon: Kandis Cocking, MD;  Location: [facility];  Service: General;  Laterality: N/A;   STOMACH SURGERY     Allergies  Allergen Reactions   Butorphanol Nausea And Vomiting   Cephalexin Swelling   Chlorhexidine Gluconate Itching    Demerol Nausea And Vomiting   Stadol [Butorphanol Tartrate] Nausea And Vomiting      Objective:    Physical Exam Vitals and nursing note reviewed.  Constitutional:      Appearance: Normal appearance.  Cardiovascular:     Rate and Rhythm: Normal rate and regular rhythm.  Pulmonary:     Effort: Pulmonary effort is normal.     Breath sounds: Normal breath sounds.  Musculoskeletal:        General: Normal range of motion.  Skin:    General: Skin is warm and dry.  Neurological:     Mental Status: She is alert.  Psychiatric:        Mood and Affect: Mood normal.        Behavior: Behavior normal.    BP (!) 164/87 (BP Location: Left Arm, Patient Position: Sitting,  Cuff Size: Normal)   Pulse 82   Temp 98.7 F (37.1 C) (Temporal)   Ht 5\' 3"  (1.6 m)   Wt 152 lb 3.2 oz (69 kg)   LMP 04/03/2012   SpO2 97%   BMI 26.96 kg/m  Wt Readings from Last 3 Encounters:  11/14/23 152 lb 3.2 oz (69 kg)  08/02/23 151 lb (68.5 kg)  07/10/23 151 lb (68.5 kg)     Dulce Sellar, NP

## 2023-11-14 NOTE — Assessment & Plan Note (Addendum)
chronic, stable last lipids 10/2022 wnl taking Lipitor 40mg  qd sending refill f/u 3 mos w/fasting labs

## 2023-11-16 ENCOUNTER — Telehealth: Payer: Self-pay

## 2023-11-16 NOTE — Telephone Encounter (Signed)
Copied from CRM [id number]. Topic: Clinical - Lab/Test Results >> Nov 16, 2023  8:21 AM Georgeanna Harrison H wrote: Reason for CRM: Pt would like a call back about results   I called pt in regards to message above and LVM letting pt know These results have not been read by PCP yet, but when she does I will give her a call or she will receive a message through MyChart.

## 2024-02-13 ENCOUNTER — Ambulatory Visit: Payer: Federal, State, Local not specified - PPO | Admitting: Family

## 2024-03-12 ENCOUNTER — Ambulatory Visit: Admitting: Family

## 2024-05-21 ENCOUNTER — Ambulatory Visit: Admitting: Family

## 2024-05-21 ENCOUNTER — Encounter: Payer: Self-pay | Admitting: Family

## 2024-05-21 VITALS — BP 136/80 | HR 95 | Temp 98.1°F | Ht 63.0 in | Wt 156.0 lb

## 2024-05-21 DIAGNOSIS — E118 Type 2 diabetes mellitus with unspecified complications: Secondary | ICD-10-CM

## 2024-05-21 DIAGNOSIS — E785 Hyperlipidemia, unspecified: Secondary | ICD-10-CM

## 2024-05-21 DIAGNOSIS — Z780 Asymptomatic menopausal state: Secondary | ICD-10-CM | POA: Diagnosis not present

## 2024-05-21 DIAGNOSIS — E1159 Type 2 diabetes mellitus with other circulatory complications: Secondary | ICD-10-CM

## 2024-05-21 DIAGNOSIS — Z7984 Long term (current) use of oral hypoglycemic drugs: Secondary | ICD-10-CM

## 2024-05-21 DIAGNOSIS — I152 Hypertension secondary to endocrine disorders: Secondary | ICD-10-CM

## 2024-05-21 DIAGNOSIS — F39 Unspecified mood [affective] disorder: Secondary | ICD-10-CM

## 2024-05-21 DIAGNOSIS — E1169 Type 2 diabetes mellitus with other specified complication: Secondary | ICD-10-CM | POA: Diagnosis not present

## 2024-05-21 LAB — LIPID PANEL
Cholesterol: 165 mg/dL (ref 0–200)
HDL: 61.3 mg/dL (ref >39.00)
LDL Cholesterol: 92 mg/dL (ref 0–99)
NonHDL: 103.97
Total CHOL/HDL Ratio: 3
Triglycerides: 60 mg/dL (ref 0.0–149.0)
VLDL: 12 mg/dL (ref 0.0–40.0)

## 2024-05-21 LAB — COMPREHENSIVE METABOLIC PANEL WITH GFR
ALT: 12 U/L (ref 0–35)
AST: 16 U/L (ref 0–37)
Albumin: 4 g/dL (ref 3.5–5.2)
Alkaline Phosphatase: 58 U/L (ref 39–117)
BUN: 13 mg/dL (ref 6–23)
CO2: 29 meq/L (ref 19–32)
Calcium: 9.1 mg/dL (ref 8.4–10.5)
Chloride: 106 meq/L (ref 96–112)
Creatinine, Ser: 0.65 mg/dL (ref 0.40–1.20)
GFR: 93.43 mL/min (ref >60.00)
Glucose, Bld: 106 mg/dL — ABNORMAL HIGH (ref 70–99)
Potassium: 4.4 meq/L (ref 3.5–5.1)
Sodium: 140 meq/L (ref 135–145)
Total Bilirubin: 0.5 mg/dL (ref 0.2–1.2)
Total Protein: 6.7 g/dL (ref 6.0–8.3)

## 2024-05-21 LAB — HEMOGLOBIN A1C: Hgb A1c MFr Bld: 7 % — ABNORMAL HIGH (ref 4.6–6.5)

## 2024-05-21 LAB — VITAMIN D 25 HYDROXY (VIT D DEFICIENCY, FRACTURES): VITD: 37.07 ng/mL (ref 30.00–100.00)

## 2024-05-21 MED ORDER — METFORMIN HCL ER 500 MG PO TB24: 500.0000 mg | ORAL_TABLET | Freq: Two times a day (BID) | ORAL | 3 refills | Status: AC

## 2024-05-21 MED ORDER — ESCITALOPRAM OXALATE 5 MG PO TABS: 5.0000 mg | ORAL_TABLET | Freq: Every day | ORAL | 3 refills | Status: AC

## 2024-05-21 MED ORDER — OLMESARTAN MEDOXOMIL 20 MG PO TABS: 20.0000 mg | ORAL_TABLET | Freq: Every day | ORAL | 3 refills | Status: AC

## 2024-05-21 MED ORDER — ATORVASTATIN CALCIUM 40 MG PO TABS: ORAL_TABLET | ORAL | 3 refills | Status: AC

## 2024-05-21 NOTE — Progress Notes (Signed)
 Patient ID: Megan Gomez, female    DOB: 06-27-1961, 63 y.o.   MRN: [id number]  Chief Complaint  Patient presents with   Diabetes    Follow up on med changes; doing okay with meds; no concerns  Discussed the use of AI scribe software for clinical note transcription with the patient, who gave verbal consent to proceed.  History of Present Illness Megan Gomez is a 63 year old female with hypertension and type 2 diabetes who presents for medication management and follow-up.  Her blood pressure is just slightly elevated today.  She takes olmesartan  20 mg for hypertension. Her type 2 diabetes is managed with metformin  twice daily, with recent A1c levels between 7.1 and 7.2. Fasting blood sugar levels at home have not exceeded 130 mg/dL. She has not started glipizide . She takes atorvastatin  for cholesterol and vitamin D supplements. For anxiety and depression, she uses a low dose of Lexapro  daily. She participates in a yoga chair class several times a week, improving flexibility and stress management. She is cautious with sugar intake, avoids high-sugar drinks, and is mindful of fruit consumption. She grows fruits and vegetables in her yard, sharing them with neighbors and family.  Assessment & Plan Type 2 Diabetes Mellitus Suboptimal control with Hemoglobin A1c at 7.1-7.2%. On metformin  500 mg twice daily. Glipizide  not initiated due to preference for diet and metformin  management. Discussed potential metformin  increase if diet insufficient. - Check Hemoglobin A1c today - Discuss metformin  dosage increase if dietary changes insufficient. - Encourage taking low dose Glipizide  2.5mg  qd - Encourage regular blood glucose monitoring, especially fasting levels, at least 3-4d/week, goal of < 120. - Advised on dietary modifications, avoid white carbs, carbs with fiber and limit fruit to just 2-3 servings daily. - F/U in 6mos  Hypertension On olmesartan  20 mg daily, no concerns, BP wnl. -  Refill olmesartan  20 mg with a 90-day supply. - F/U in 6mos  Hyperlipidemia Managed with atorvastatin  40mg  qd. Last lipids 1.5 yrs ago, wnl.  - Continue atorvastatin  40mg  as prescribed. - Check lipid panel as part of routine labs today. - F/U in 1 year  Depression and Anxiety Managed with low dose Lexapro . Denies any SE, doing well. - Continue Lexapro  5mg  qd as prescribed, refill sent. - F/U in 6mos  General Health Maintenance Engages in yoga chair class. Advised to increase vitamin D for bone health, immunity, and mood. - Increase vitamin D intake to 4000 IU daily. - Encourage continuation of yoga chair class. - Check vitamin D levels as part of routine labs.  Follow-up Routine follow-up necessary for chronic condition management. - Order comprehensive lab work including A1c, metabolic panel, liver and kidney function tests, and cholesterol. - Follow up with lab results via MyChart or phone call.   Subjective:    Outpatient Medications Prior to Visit  Medication Sig Dispense Refill   acetaminophen  (TYLENOL ) 325 MG tablet Take 650 mg by mouth every 6 (six) hours as needed.     atorvastatin  (LIPITOR) 40 MG tablet TAKE 1 TABLET BY MOUTH DAILY 90 tablet 3   B Complex Vitamins (B COMPLEX 1 PO) Place under the tongue.     calcium  carbonate (TUMS - DOSED IN MG ELEMENTAL CALCIUM ) 500 MG chewable tablet Chew 1 tablet by mouth daily.     cholecalciferol (VITAMIN D3) 25 MCG (1000 UNIT) tablet Take 1,000 Units by mouth daily.     escitalopram  (LEXAPRO ) 5 MG tablet Take 1 tablet (5 mg total) by mouth daily. 90 tablet  3   metFORMIN  (GLUCOPHAGE -XR) 500 MG 24 hr tablet Take 1 tablet (500 mg total) by mouth 2 (two) times daily. 180 tablet 3   Multiple Vitamin (MULTIVITAMIN) tablet Take 1 tablet by mouth daily.     olmesartan  (BENICAR ) 20 MG tablet Take 1 tablet (20 mg total) by mouth daily. 30 tablet 5   glipiZIDE  (GLUCOTROL  XL) 2.5 MG 24 hr tablet Take 1 tablet (2.5 mg total) by mouth daily  with breakfast. Or lunch. (Patient not taking: Reported on 05/21/2024) 90 tablet 1   No facility-administered medications prior to visit.   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   Vitreomacular adhesion of right eye 10/06/2020   Past Surgical History:  Procedure Laterality Date   BREAST EXCISIONAL BIOPSY Left 2001   BREATH TEK H PYLORI  04/04/2012   Procedure: BREATH TEK H PYLORI;  Surgeon: Alm VEAR Angle, MD;  Location: [facility];  Service: General;  Laterality: N/A;   CESAREAN SECTION  2001, 2005   COLONOSCOPY  2013   EYE SURGERY  1995   Lasik   INNER EAR SURGERY  1998   LAPAROSCOPIC GASTRIC BANDING  08/28/2012   Procedure: LAPAROSCOPIC GASTRIC BANDING;  Surgeon: Alm VEAR Angle, MD;  Location: [facility];  Service: General;  Laterality: N/A;   STOMACH SURGERY     Allergies  Allergen Reactions   Butorphanol Nausea And Vomiting   Cephalexin  Swelling   Chlorhexidine Gluconate Itching   Demerol Nausea And Vomiting   Stadol [Butorphanol Tartrate] Nausea And Vomiting      Objective:    Physical Exam Vitals and nursing note reviewed.  Constitutional:      Appearance: Normal appearance.   Cardiovascular:     Rate and Rhythm: Normal rate and regular rhythm.  Pulmonary:     Effort: Pulmonary effort is normal.     Breath sounds: Normal breath sounds.   Musculoskeletal:        General: Normal range of motion.   Skin:    General: Skin is warm and dry.   Neurological:     Mental Status: She is alert.   Psychiatric:        Mood and Affect: Mood normal.        Behavior: Behavior normal.    BP 136/80   Pulse 95   Temp 98.1 F (36.7 C)   Ht 5' 3 (1.6 m)   Wt  156 lb (70.8 kg)   LMP 04/03/2012   SpO2 99%   BMI 27.63 kg/m  Wt Readings from Last 3 Encounters:  05/21/24 156 lb (70.8 kg)  11/14/23 152 lb 3.2 oz (69 kg)  08/02/23 151 lb (68.5 kg)       Lucius Krabbe, NP

## 2024-05-21 NOTE — Assessment & Plan Note (Signed)
 Managed with atorvastatin  40mg  qd. - Continue atorvastatin  40mg  as prescribed. - Check lipid panel as part of routine labs today. - F/U in 1 year

## 2024-05-21 NOTE — Assessment & Plan Note (Signed)
 Suboptimal control with Hemoglobin A1c at 7.1-7.2%. On metformin  500 mg twice daily. Glipizide  not initiated due to preference for diet and metformin  management. Discussed potential metformin  increase if diet insufficient. - Check Hemoglobin A1c today - Discuss metformin  dosage increase if dietary changes insufficient. - Encourage taking low dose Glipizide  2.5mg  qd - Encourage regular blood glucose monitoring, especially fasting levels, at least 3-4d/week, goal of < 120. - Advised on dietary modifications, avoid white carbs, carbs with fiber and limit fruit to just 2-3 servings daily. - F/U in 6mos

## 2024-05-21 NOTE — Assessment & Plan Note (Signed)
 On olmesartan  20 mg daily, no concerns, BP wnl. - Refill olmesartan  20 mg with a 90-day supply. - F/U in 6mos

## 2024-05-23 ENCOUNTER — Ambulatory Visit: Payer: Self-pay | Admitting: Family

## 2024-08-28 DIAGNOSIS — Z1151 Encounter for screening for human papillomavirus (HPV): Secondary | ICD-10-CM | POA: Diagnosis not present

## 2024-08-28 DIAGNOSIS — Z6827 Body mass index (BMI) 27.0-27.9, adult: Secondary | ICD-10-CM | POA: Diagnosis not present

## 2024-08-28 DIAGNOSIS — Z01419 Encounter for gynecological examination (general) (routine) without abnormal findings: Secondary | ICD-10-CM | POA: Diagnosis not present

## 2024-08-28 DIAGNOSIS — Z124 Encounter for screening for malignant neoplasm of cervix: Secondary | ICD-10-CM | POA: Diagnosis not present

## 2024-10-09 DIAGNOSIS — Z1231 Encounter for screening mammogram for malignant neoplasm of breast: Secondary | ICD-10-CM | POA: Diagnosis not present

## 2024-10-09 DIAGNOSIS — Z13820 Encounter for screening for osteoporosis: Secondary | ICD-10-CM | POA: Diagnosis not present

## 2024-11-19 ENCOUNTER — Ambulatory Visit: Admitting: Family

## 2025-01-27 ENCOUNTER — Ambulatory Visit: Admitting: Family
